# Patient Record
Sex: Male | Born: 1950 | Race: White | Hispanic: No | Marital: Married | Smoking: Never smoker
Health system: Southern US, Community
[De-identification: ages and names within clinical notes are randomized; demographics above are authoritative.]

## PROBLEM LIST (undated history)

## (undated) DIAGNOSIS — E069 Thyroiditis, unspecified: Secondary | ICD-10-CM

## (undated) DIAGNOSIS — I480 Paroxysmal atrial fibrillation: Secondary | ICD-10-CM

## (undated) DIAGNOSIS — I471 Supraventricular tachycardia, unspecified: Secondary | ICD-10-CM

## (undated) DIAGNOSIS — R002 Palpitations: Secondary | ICD-10-CM

## (undated) DIAGNOSIS — F419 Anxiety disorder, unspecified: Secondary | ICD-10-CM

## (undated) DIAGNOSIS — G4730 Sleep apnea, unspecified: Secondary | ICD-10-CM

## (undated) HISTORY — PX: POLYPECTOMY: SHX149

## (undated) HISTORY — PX: COLONOSCOPY: SHX174

## (undated) HISTORY — DX: Supraventricular tachycardia: I47.1

## (undated) HISTORY — DX: Anxiety disorder, unspecified: F41.9

## (undated) HISTORY — DX: Palpitations: R00.2

## (undated) HISTORY — DX: Supraventricular tachycardia, unspecified: I47.10

## (undated) HISTORY — DX: Sleep apnea, unspecified: G47.30

## (undated) HISTORY — DX: Paroxysmal atrial fibrillation: I48.0

## (undated) HISTORY — DX: Thyroiditis, unspecified: E06.9

---

## 2005-04-11 ENCOUNTER — Ambulatory Visit: Payer: Self-pay | Admitting: Cardiovascular Disease

## 2008-08-27 ENCOUNTER — Emergency Department (HOSPITAL_COMMUNITY): Admission: EM | Admit: 2008-08-27 | Discharge: 2008-08-27 | Payer: Self-pay | Admitting: Nurse Practitioner

## 2009-04-28 ENCOUNTER — Emergency Department (HOSPITAL_COMMUNITY): Admission: EM | Admit: 2009-04-28 | Discharge: 2009-04-28 | Payer: Self-pay | Admitting: Emergency Medicine

## 2009-05-04 DIAGNOSIS — R002 Palpitations: Secondary | ICD-10-CM | POA: Insufficient documentation

## 2009-05-07 ENCOUNTER — Ambulatory Visit: Payer: Self-pay | Admitting: Cardiovascular Disease

## 2010-02-25 ENCOUNTER — Encounter (INDEPENDENT_AMBULATORY_CARE_PROVIDER_SITE_OTHER): Payer: Self-pay | Admitting: *Deleted

## 2010-07-08 ENCOUNTER — Encounter: Payer: Self-pay | Admitting: Cardiovascular Disease

## 2010-07-08 ENCOUNTER — Encounter: Payer: Self-pay | Admitting: Internal Medicine

## 2010-07-09 ENCOUNTER — Encounter: Payer: Self-pay | Admitting: Cardiovascular Disease

## 2010-07-10 ENCOUNTER — Telehealth (INDEPENDENT_AMBULATORY_CARE_PROVIDER_SITE_OTHER): Payer: Self-pay | Admitting: *Deleted

## 2010-07-13 ENCOUNTER — Encounter: Payer: Self-pay | Admitting: Cardiovascular Disease

## 2010-07-13 ENCOUNTER — Emergency Department (HOSPITAL_COMMUNITY): Admission: EM | Admit: 2010-07-13 | Discharge: 2010-07-13 | Payer: Self-pay | Admitting: Emergency Medicine

## 2010-07-15 ENCOUNTER — Telehealth: Payer: Self-pay | Admitting: Cardiovascular Disease

## 2010-08-21 ENCOUNTER — Encounter: Payer: Self-pay | Admitting: Internal Medicine

## 2010-08-21 ENCOUNTER — Ambulatory Visit: Payer: Self-pay | Admitting: Internal Medicine

## 2010-08-21 DIAGNOSIS — I471 Supraventricular tachycardia: Secondary | ICD-10-CM | POA: Insufficient documentation

## 2010-09-26 ENCOUNTER — Ambulatory Visit
Admission: RE | Admit: 2010-09-26 | Discharge: 2010-09-26 | Payer: Self-pay | Source: Home / Self Care | Attending: Cardiovascular Disease | Admitting: Cardiovascular Disease

## 2010-10-08 NOTE — Progress Notes (Signed)
  Phone Note Outgoing Call   Call placed by: Scherrie Bateman, LPN,  July 10, 2010 2:41 PM Call placed to: Patient Summary of Call: PT CALLED RE  THE EKG'S DR Eden Emms REVIEWED  DX FLUTTER PER DR NISHAN  NEEDS TO SEE EP IN F/U APPT MADE FOR 08/21/10 AT 11:30 AM WITH DR Johney Frame Initial call taken by: Scherrie Bateman, LPN,  July 10, 2010 2:42 PM

## 2010-10-08 NOTE — Letter (Signed)
Summary: Appointment - Reminder 2  [facility] [employer], Main Office  [address]   [place], [place] [address]   Phone: [phone]  Fax: [phone]     February 25, 2010 MRN: [id number]   Eric Kaufman [address] [place], [place]  [address]   Dear Mr. Zebrowski,  Our records indicate that it is time to schedule a follow-up appointment with Dr. Eden Emms in August. It is very important that we reach you to schedule this appointment. We look forward to participating in your health care needs. Please contact us at the number listed above at your earliest convenience to schedule your appointment.  If you are unable to make an appointment at this time, give [place] a call so we can update our records.     Sincerely,   Migdalia Dk [facility] Scheduling Team

## 2010-10-08 NOTE — Progress Notes (Signed)
Summary: pt wants to talk to nurse  Phone Note Call from Patient Call back at Home Phone [phone]   Caller: Patient Reason for Call: Talk to Nurse, Talk to Doctor Summary of Call: pt needs to discuss if he needs an appt to see Dunbar Buras he talk to Dr. Eden Emms last week and wants to talk to the nurse to confirm some information Initial call taken by: Omer Jack,  July 15, 2010 8:55 AM  Follow-up for Phone Call        spoke with pt, last week he had several episodes of fast heart rate and was able to stop it, but sat he was not able to slow his pulse and went to urgent care and his heart rate was 220. he was given something by EMS and by the time he got to [facility] he was back in the 60's. he questioned if he should take the atenolol, be seen or what he should do next. will get EKG's from urgent care and discuss with dr allred Deliah Goody, RN  July 15, 2010 10:03 AM   Additional Follow-up for Phone Call Additional follow up Details #1::        Per pt calling back, aware that Stanton Kidney is not in the office today. phone # [phone] Lorne Skeens  July 16, 2010 12:27 PM     Additional Follow-up for Phone Call Additional follow up Details #2::    Pt calling in regard to ekg from urgent care and did he need to be seen earlier than 12/14 with Dr. Johney Frame. Mylo Red RN  Additional Follow-up for Phone Call Additional follow up Details #3:: Details for Additional Follow-up Action Taken: Should take Atenolol and ok to be seen by EPS on 12/14    PT AWARE Scherrie Bateman, LPN  July 17, 2010 5:03 PM Additional Follow-up by: Colon Branch, MD, [facility],  July 17, 2010 10:33 AM

## 2010-10-10 NOTE — Assessment & Plan Note (Signed)
Summary: E4V   Primary Provider:  Eden Emms, MD  CC:  check up.  History of Present Illness: Eric Kaufman is a pleasant 60 yo WM with a h/o tachypalpitations and documented SVT  He reports having abrupt onset and termination of palpitations for many years.  He has previously been able to terminate episodes with vagal maneuvers.  Most recently, he was prescribed mucinex D for URI.  He developed symptomatic tachypalpitations 07/08/10 for which he presented to Urgent care and was documented to have SVT. EKG revealed SVT (Likely atrial flutter) 160 bpm.  The arrhythmia spontaneously terminated and decongestants were discontinued.  He returned to urgent care 07/13/10 with recurrent SVT (a more narrow complex tachycardia at 200 bpm).  His tachycardia terminated and he was given atenolol to take as needed.  He has not taken medicine since that time.  During SVT, he reports palpitations but denies CP, SOB, presyncope, dizziness, syncope, or other symptoms.  He reports occasional palpitations but denies any further sustained arrhythmias. Saw Dr Johney Frame in December and did not want to follow through with ablation or daily meds.    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 As Needed  Allergies (verified): 1)  ! Asa 2)  ! Penicillin  Past History:  Past Medical History: Last updated: 08/21/2010 SVT MVP Papiledema:  Family History: Last updated: 05/10/09 Father died age 35 lung cancer  Social History: Last updated: 2009/05/10  non-smoker, non-drinker  Artist Lots of anxiety  Review of Systems       Denies fever, malais, weight loss, blurry vision, decreased visual acuity, cough, sputum, SOB, hemoptysis, pleuritic pain, palpitaitons, heartburn, abdominal pain, melena, lower extremity edema, claudication, or rash.   Vital Signs:  Patient profile:   60 year old male Height:      76  inches Weight:      146 pounds BMI:     17.84 Pulse rate:   68 / minute Resp:     14 per minute BP sitting:   126 / 69  (left arm)  Vitals Entered By: Kem Parkinson (September 26, 2010 4:18 PM)  Physical Exam  General:  Affect appropriate Healthy:  appears stated age HEENT: normal Neck supple with no adenopathy JVP normal no bruits no thyromegaly Lungs clear with no wheezing and good diaphragmatic motion Heart:  S1/S2 no murmur,rub, gallop or click PMI normal Abdomen: benighn, BS positve, no tenderness, no AAA no bruit.  No HSM or HJR Distal pulses intact with no bruits No edema Neuro non-focal Skin warm and dry    Impression & Recommendations:  Problem # 1:  PSVT (ICD-427.0) All details outlined at length by Dr Johney Frame and myself.  Continue as needed vagal maneuvers.  He is allergic to ASA  [place] score 0 no need for coumadin. Atenolol as needed.  F/U 6 months His updated medication list for this problem includes:    Atenolol 25 Mg Tabs (Atenolol) .Marland Kitchen... As needed  Patient Instructions: 1)  Your physician wants you to follow-up in: 6 MONTHS  You will receive a reminder letter in the mail two months in advance. If you don't receive a letter, please call our office to schedule the follow-up appointment.

## 2010-10-10 NOTE — Assessment & Plan Note (Signed)
Summary: Eric Kaufman   Visit Type:  Initial Consult Primary Provider:  Eden Emms, MD   History of Present Illness: Eric Kaufman is a pleasant 60 yo WM with a h/o tachypalpitations and documented SVT who presents today for EP consultation.  He reports having abrupt onset and termination of palpitations for many years.  He has previously been able to terminate episodes with vagal maneuvers.  Most recently, he was prescribed mucinex D for URI.  He developed symptomatic tachypalpitations 07/08/10 for which he presented to Urgent care and was documented to have SVT. EKG revealed SVT (Likely atrial flutter) 160 bpm.  The arrhythmia spontaneously terminated and decongestants were discontinued.  He returned to urgent care 07/13/10 with recurrent SVT (a more narrow complex tachycardia at 200 bpm).  His tachycardia terminated and he was given atenolol to take as needed.  He has not taken medicine since that time.  During SVT, he reports palpitations but denies CP, SOB, presyncope, dizziness, syncope, or other symptoms.  He reports occasional palpitations but denies any further sustained arrhythmias.   Current Medications (verified): 1)  None  Allergies (verified): 1)  ! Asa 2)  ! Penicillin  Past History:  Past Medical History: SVT MVP Papiledema:  Family History: Reviewed history from 05/04/2009 and no changes required. Father died age 16 lung cancer  Social History: Reviewed history from 05/04/2009 and no changes required.  non-smoker, non-drinker  Artist Lots of anxiety  Review of Systems       All systems are reviewed and negative except as listed in the HPI.   Vital Signs:  Patient profile:   60 year old male Height:      76 inches Weight:      143 pounds BMI:     17.47 Pulse rate:   71 / minute BP sitting:   122 / 70  (left arm)  Vitals Entered By: Laurance Flatten CMA (August 21, 2010 12:06 PM)  Physical Exam  General:  Affect appropriate but anxious appearing Healthy:   appears stated age HEENT: normal Neck supple with no adenopathy JVP normal no bruits no thyromegaly Lungs clear with no wheezing and good diaphragmatic motion Heart:  S1/S2 no murmur,rub, gallop or click PMI normal Abdomen: benighn, BS positve, no tenderness, no AAA no bruit.  No HSM or HJR Distal pulses intact with no bruits No edema Neuro non-focal Skin warm and dry    EKG  Procedure date:  07/08/2010  Findings:      SVT at 160 bpm, likely atrial flutter  EKG  Procedure date:  08/21/2010  Findings:      SVT at 200 bpm  EKG  Procedure date:  08/21/2010  Findings:      sinus rhythm 70 bpm, PR 124, RsR1,   Impression & Recommendations:  Problem # 1:  PSVT (ICD-427.0) The patient presents today for EP consultation regarding SVT.  He has longstanding abrupt onset/ termination of tachypalpitations which terminate with vagal maneuvers.  Recently, he has been documented to have SVT at 200 bpm as well as likely atrial flutter at 160 bpm.  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 His CHADSVasc score is 0.  He reports allergy to aspirin.  I therefore would not recommend anticoagulation at this time. Therapeutic strategies for SVT and atrial flutter including medicine and ablation were discussed in detail with the patient and his spouse today. Risk, benefits, and alternatives to EP study and radiofrequency ablation were also discussed in detail  today. The patient is very anxious and heavily concerned about the risks of ablation.  Though I have informed him that the overall risks of ablation are quite low, he is not ready to consider ablation at this time.  Certainly medical therapy would be a reasonable alternative, however, he does not wish to take daily medicine at this time.  He states that he has managed his arrhythmia with vagal maneuvers "for years" and therefore does not wish to make changes at this time. I have  therefore informed him to follow-up with Dr Eden Emms.  If he decides to take daily medicine cardizem and flecainide would be reasonable options.  Otherwise, if he wishes to further consider ablation, I would be happy to discuss this further at that time.  Patient Instructions: 1)  Follow-up with Dr Eden Emms. 2)  I will see the patient as needed.

## 2010-10-30 NOTE — Letter (Signed)
Summary: [facility]  [facility]   Imported By: Marylou Mccoy 10/21/2010 11:44:42  _____________________________________________________________________  External Attachment:    Type:   Image     Comment:   External Document

## 2010-10-30 NOTE — Letter (Signed)
Summary: [facility]  [facility]   Imported By: Marylou Mccoy 10/21/2010 11:42:37  _____________________________________________________________________  External Attachment:    Type:   Image     Comment:   External Document

## 2010-12-14 LAB — POCT CARDIAC MARKERS
CKMB, poc: <1 ng/mL — ABNORMAL LOW (ref 1.0–8.0)
Myoglobin, poc: 52.1 ng/mL (ref 12–200)
Troponin i, poc: <0.05 ng/mL (ref 0.00–0.09)

## 2010-12-14 LAB — CBC
HCT: 47.8 % (ref 39.0–52.0)
Platelets: 252 10*3/uL (ref 150–400)
RDW: 13.1 % (ref 11.5–15.5)
WBC: 6.2 10*3/uL (ref 4.0–10.5)

## 2010-12-14 LAB — BASIC METABOLIC PANEL
BUN: 11 mg/dL (ref 6–23)
Calcium: 9 mg/dL (ref 8.4–10.5)
Creatinine, Ser: 0.73 mg/dL (ref 0.4–1.5)
GFR calc non Af Amer: >60 mL/min (ref >60)
Glucose, Bld: 94 mg/dL (ref 70–99)
Potassium: 3.6 mEq/L (ref 3.5–5.1)

## 2010-12-14 LAB — DIFFERENTIAL
Basophils Absolute: 0 10*3/uL (ref 0.0–0.1)
Lymphocytes Relative: 26 % (ref 12–46)
Lymphs Abs: 1.6 10*3/uL (ref 0.7–4.0)
Neutro Abs: 3.9 10*3/uL (ref 1.7–7.7)
Neutrophils Relative %: 62 % (ref 43–77)

## 2011-04-13 ENCOUNTER — Inpatient Hospital Stay (INDEPENDENT_AMBULATORY_CARE_PROVIDER_SITE_OTHER)
Admission: RE | Admit: 2011-04-13 | Discharge: 2011-04-13 | Disposition: A | Discharge status: Home / Self Care | Payer: 59 | Source: Ambulatory Visit | Attending: Family Medicine | Admitting: Family Medicine

## 2011-04-13 DIAGNOSIS — M76899 Other specified enthesopathies of unspecified lower limb, excluding foot: Secondary | ICD-10-CM

## 2011-06-06 ENCOUNTER — Other Ambulatory Visit: Payer: Self-pay | Admitting: Emergency Medicine

## 2011-06-06 DIAGNOSIS — E0590 Thyrotoxicosis, unspecified without thyrotoxic crisis or storm: Secondary | ICD-10-CM

## 2011-06-08 ENCOUNTER — Inpatient Hospital Stay (INDEPENDENT_AMBULATORY_CARE_PROVIDER_SITE_OTHER)
Admission: RE | Admit: 2011-06-08 | Discharge: 2011-06-08 | Disposition: A | Discharge status: Home / Self Care | Payer: 59 | Source: Ambulatory Visit | Attending: Emergency Medicine | Admitting: Emergency Medicine

## 2011-06-08 DIAGNOSIS — R070 Pain in throat: Secondary | ICD-10-CM

## 2011-06-08 DIAGNOSIS — J3489 Other specified disorders of nose and nasal sinuses: Secondary | ICD-10-CM

## 2011-06-17 ENCOUNTER — Encounter (HOSPITAL_COMMUNITY)
Admission: RE | Admit: 2011-06-17 | Discharge: 2011-06-17 | Disposition: A | Discharge status: Home / Self Care | Payer: 59 | Source: Ambulatory Visit | Attending: Emergency Medicine | Admitting: Emergency Medicine

## 2011-06-17 DIAGNOSIS — E0590 Thyrotoxicosis, unspecified without thyrotoxic crisis or storm: Secondary | ICD-10-CM | POA: Insufficient documentation

## 2011-06-18 ENCOUNTER — Other Ambulatory Visit: Payer: Self-pay | Admitting: Emergency Medicine

## 2011-06-18 ENCOUNTER — Encounter (HOSPITAL_COMMUNITY)
Admission: RE | Admit: 2011-06-18 | Discharge: 2011-06-18 | Disposition: A | Discharge status: Home / Self Care | Payer: 59 | Source: Ambulatory Visit | Attending: Emergency Medicine | Admitting: Emergency Medicine

## 2011-06-18 DIAGNOSIS — R634 Abnormal weight loss: Secondary | ICD-10-CM | POA: Insufficient documentation

## 2011-06-18 DIAGNOSIS — R5383 Other fatigue: Secondary | ICD-10-CM | POA: Insufficient documentation

## 2011-06-18 DIAGNOSIS — E0590 Thyrotoxicosis, unspecified without thyrotoxic crisis or storm: Secondary | ICD-10-CM

## 2011-06-18 DIAGNOSIS — R946 Abnormal results of thyroid function studies: Secondary | ICD-10-CM | POA: Insufficient documentation

## 2011-06-18 DIAGNOSIS — R5381 Other malaise: Secondary | ICD-10-CM | POA: Insufficient documentation

## 2011-06-18 DIAGNOSIS — M542 Cervicalgia: Secondary | ICD-10-CM | POA: Insufficient documentation

## 2011-06-18 DIAGNOSIS — R450 Nervousness: Secondary | ICD-10-CM | POA: Insufficient documentation

## 2011-06-20 ENCOUNTER — Telehealth: Payer: Self-pay | Admitting: Cardiovascular Disease

## 2011-06-20 NOTE — Telephone Encounter (Signed)
Reviewed and pt is comfortable with Atenolol 50mg  daily for heart rate.  He has had a dull headache at the base of the neck & occasional "woozy feeling" but was told that was from the thyroiditis. Pt is overdue for follow-up with Dr. Eden Emms. Appt made for 07/04/11 Mylo Red RN

## 2011-06-20 NOTE — Telephone Encounter (Signed)
Pt was diagnosed with throiditis, and having trouble with the meds due to his BP, please call

## 2011-06-20 NOTE — Telephone Encounter (Signed)
Pt calls today b/c he has been diagnosed with thyroiditis. His endocrinologist, Dr. Lurene Shadow, told him this could affect his heartrate.  He has noticed his heart rate to be 100 upon waking this morning. Then 83.  He also notes his bp has been as low as 98/62.  He has not taken atenolol this morningThe past few weeks he has been taking 50mg  of Atenolol.  He is concerned that the thyroiditis will last for a few months & with the effect it has on his heart rate would like to know how much atenolol he can take. I will ask DOD to review and return call to pt. Mylo Red RN

## 2011-07-02 ENCOUNTER — Encounter: Payer: Self-pay | Admitting: *Deleted

## 2011-07-03 ENCOUNTER — Encounter: Payer: Self-pay | Admitting: Cardiovascular Disease

## 2011-07-04 ENCOUNTER — Ambulatory Visit (INDEPENDENT_AMBULATORY_CARE_PROVIDER_SITE_OTHER): Payer: 59 | Admitting: Cardiovascular Disease

## 2011-07-04 ENCOUNTER — Encounter: Payer: Self-pay | Admitting: Cardiovascular Disease

## 2011-07-04 DIAGNOSIS — E069 Thyroiditis, unspecified: Secondary | ICD-10-CM | POA: Insufficient documentation

## 2011-07-04 LAB — TSH: TSH: 0.06 u[IU]/mL — ABNORMAL LOW (ref 0.35–5.50)

## 2011-07-04 MED ORDER — ATENOLOL 25 MG PO TABS: ORAL_TABLET | ORAL | Status: DC

## 2011-07-04 NOTE — Progress Notes (Signed)
Eric Kaufman is a pleasant 60 yo WM with a h/o tachypalpitations and documented SVT He reports having abrupt onset and termination of palpitations for many years. He has previously been able to terminate episodes with vagal maneuvers. Most recently, he was prescribed mucinex D for URI. He developed symptomatic tachypalpitations 07/08/10 for which he presented to Urgent care and was documented to have SVT. EKG revealed SVT (Likely atrial flutter) 160 bpm. The arrhythmia spontaneously terminated and decongestants were discontinued. He returned to urgent care 07/13/10 with recurrent SVT (a more narrow complex tachycardia at 200 bpm). His tachycardia terminated and he was given atenolol to take as needed. He has not taken medicine since that time. During SVT, he reports palpitations but denies CP, SOB, presyncope, dizziness, syncope, or other symptoms. He reports occasional palpitations but denies any further sustained arrhythmias. Saw Dr Johney Frame in December and did not want to follow through with ablation or daily meds.   Has thyroiditis now.  TSH less than .01.  Thyroid scan with no uptake.  Seeing [facility].  Will go up on Atenolol with extra 1/2 pill at night.  Hopefully thyroiditis will be self limited.  He has lost 6 lbs and looks gaunt  ROS: Denies fever, malais, weight loss, blurry vision, decreased visual acuity, cough, sputum, SOB, hemoptysis, pleuritic pain, palpitaitons, heartburn, abdominal pain, melena, lower extremity edema, claudication, or rash.  All other systems reviewed and negative  General: Affect appropriate Healthy:  appears stated age HEENT: normal Neck supple with no adenopathy JVP normal no bruits no thyromegaly Lungs clear with no wheezing and good diaphragmatic motion Heart:  S1/S2 no murmur,rub, gallop or click PMI normal Abdomen: benighn, BS positve, no tenderness, no AAA no bruit.  No HSM or HJR Distal pulses intact with no bruits No edema Neuro non-focal Skin warm and dry No  muscular weakness   Current Outpatient Prescriptions  Medication Sig Dispense Refill  . atenolol (TENORMIN) 25 MG tablet Take 25 mg by mouth daily.        . Ibuprofen (ADVIL) 200 MG CAPS Take by mouth as needed.          Allergies  Aspirin and Penicillins  Electrocardiogram:  Assessment and Plan

## 2011-07-04 NOTE — Assessment & Plan Note (Signed)
He wanted TSH and free T4 checked today.  F/U [facility]  Increase Atenolol

## 2011-07-04 NOTE — Progress Notes (Signed)
Addended by: Freddi Starr on: 07/04/2011 11:23 AM   Modules accepted: Orders

## 2011-07-04 NOTE — Patient Instructions (Signed)
Your physician wants you to follow-up in: 3 MONTHS You will receive a reminder letter in the mail two months in advance. If you don't receive a letter, please call our office to schedule the follow-up appointment.   Your physician recommends that you return for lab work in: TODAY  CHANGE ATENOLOL TO 25 MG IN THE MORNING AND 1/2 TABLET IN THE EVENING

## 2011-07-04 NOTE — Assessment & Plan Note (Signed)
Stable despite recent Dx of thyroiditis.  Increase Atenolol

## 2011-07-07 ENCOUNTER — Telehealth: Payer: Self-pay | Admitting: Cardiovascular Disease

## 2011-07-07 NOTE — Telephone Encounter (Signed)
New message:  Pt has a question about his medication.  Needs to speak to you regarding same

## 2011-07-07 NOTE — Telephone Encounter (Signed)
Spoke with pt, he was seen last week and we increased his atenolol to 25 mg in the am and 12.5 mg in the pm. He called to report his heart rate in the am when he wakes is 102 to 104. After he gets up his heart rate will drop 20 points, down to 80 without taking any meds. He is concerned and wanted to let dr Eden Emms know Will forward for dr Eden Emms review  Deliah Goody

## 2011-07-07 NOTE — Telephone Encounter (Signed)
Stay on higher dose atenolol since he is still very hyperthyroid

## 2011-08-11 ENCOUNTER — Other Ambulatory Visit: Payer: Self-pay | Admitting: Endocrinology

## 2011-08-11 DIAGNOSIS — E049 Nontoxic goiter, unspecified: Secondary | ICD-10-CM

## 2011-08-29 ENCOUNTER — Encounter: Payer: Self-pay | Admitting: Cardiovascular Disease

## 2011-08-29 ENCOUNTER — Ambulatory Visit (INDEPENDENT_AMBULATORY_CARE_PROVIDER_SITE_OTHER): Payer: 59 | Admitting: Cardiovascular Disease

## 2011-08-29 DIAGNOSIS — I471 Supraventricular tachycardia: Secondary | ICD-10-CM

## 2011-08-29 DIAGNOSIS — E069 Thyroiditis, unspecified: Secondary | ICD-10-CM

## 2011-08-29 NOTE — Assessment & Plan Note (Signed)
Seeing Dr Altheimer.  TSH latest over 3.  No uptake on scan.  F/U TSH in January to monitor for hypothyroidism.  Improved

## 2011-08-29 NOTE — Progress Notes (Signed)
Patient ID: QUILL GRINDER, male   DOB: 17-Mar-1951, 60 y.o.   MRN: [id number] Eric Kaufman is a pleasant 60 yo WM with a h/o tachypalpitations and documented SVT He reports having abrupt onset and termination of palpitations for many years. He has previously been able to terminate episodes with vagal maneuvers. Most recently, he was prescribed mucinex D for URI. He developed symptomatic tachypalpitations 07/08/10 for which he presented to Urgent care and was documented to have SVT. EKG revealed SVT (Likely atrial flutter) 160 bpm. The arrhythmia spontaneously terminated and decongestants were discontinued. He returned to urgent care 07/13/10 with recurrent SVT (a more narrow complex tachycardia at 200 bpm). His tachycardia terminated and he was given atenolol to take as needed. He has not taken medicine since that time. During SVT, he reports palpitations but denies CP, SOB, presyncope, dizziness, syncope, or other symptoms. He reports occasional palpitations but denies any further sustained arrhythmias. Saw Dr Eric Kaufman in December and did not want to follow through with ablation or daily meds.  Has thyroiditis now. . Thyroid scan with no uptake. Seeing Eric Kaufman. Will go up on Atenolol with extra 1/2 pill at night. Hopefully thyroiditis will be self limited. He has lost 6 lbs and looks gaunt  10/26  TSH .06  ROS: Denies fever, malais, weight loss, blurry vision, decreased visual acuity, cough, sputum, SOB, hemoptysis, pleuritic pain, palpitaitons, heartburn, abdominal pain, melena, lower extremity edema, claudication, or rash.  All other systems reviewed and negative  General: Affect appropriate Healthy:  appears stated age HEENT: normal Neck supple with no adenopathy JVP normal no bruits no thyromegaly Lungs clear with no wheezing and good diaphragmatic motion Heart:  S1/S2 no murmur,rub, gallop or click PMI normal Abdomen: benighn, BS positve, no tenderness, no AAA no bruit.  No HSM or HJR Distal pulses  intact with no bruits No edema Neuro non-focal Skin warm and dry No muscular weakness   Current Outpatient Prescriptions  Medication Sig Dispense Refill  . atenolol (TENORMIN) 25 MG tablet TAKE ONE TABLET EVERY MORNING AND 1/2 TABLET IN THE EVENING  45 tablet  12  . Ibuprofen (ADVIL) 200 MG CAPS Take by mouth as needed.          Allergies  Aspirin and Penicillins  Electrocardiogram:  Assessment and Plan

## 2011-08-29 NOTE — Assessment & Plan Note (Signed)
Resolved  Made worse by thyroiditis and suppressed TSH.  Now improved  Beta blocker D/C

## 2011-08-29 NOTE — Patient Instructions (Signed)
Your physician wants you to follow-up in: one year You will receive a reminder letter in the mail two months in advance. If you don't receive a letter, please call our office to schedule the follow-up appointment.  

## 2011-09-09 HISTORY — PX: COLONOSCOPY: SHX174

## 2011-11-07 ENCOUNTER — Emergency Department (INDEPENDENT_AMBULATORY_CARE_PROVIDER_SITE_OTHER)
Admission: EM | Admit: 2011-11-07 | Discharge: 2011-11-07 | Disposition: A | Discharge status: Home Health | Payer: 59 | Source: Home / Self Care | Attending: Family Medicine | Admitting: Family Medicine

## 2011-11-07 ENCOUNTER — Encounter (HOSPITAL_COMMUNITY): Payer: Self-pay | Admitting: Emergency Medicine

## 2011-11-07 DIAGNOSIS — F5102 Adjustment insomnia: Secondary | ICD-10-CM

## 2011-11-07 NOTE — ED Provider Notes (Signed)
History     CSN: [id number]  Arrival date & time 11/07/11  0804   First MD Initiated Contact with Patient 11/07/11 0805      Chief Complaint  Patient presents with  . Insomnia  . Dizziness    (Consider location/radiation/quality/duration/timing/severity/associated sxs/prior treatment) Patient is a 60 y.o. male presenting with weakness. The history is provided by the patient and the spouse.  Weakness Primary symptoms comment: insomnia Episode onset: off and on for sev mos, actually since young man,has had sleep issues, no eval, just want some xanax because gets anxious when doesn't sleep.  Additional symptoms include anxiety and irritability.    Past Medical History  Diagnosis Date  . PSVT   . Palpitations   . Anxiety   . Papilledema     History reviewed. No pertinent past surgical history.  Family History  Problem Relation Age of Onset  . Lung cancer Father     History  Substance Use Topics  . Smoking status: Never Smoker   . Smokeless tobacco: Not on file  . Alcohol Use: No      Review of Systems  Constitutional: Positive for irritability and fatigue.  Psychiatric/Behavioral: Positive for agitation.  All other systems reviewed and are negative.    Allergies  Aspirin and Penicillins  Home Medications   Current Outpatient Rx  Name Route Sig Dispense Refill  . ATENOLOL 25 MG PO TABS  TAKE ONE TABLET EVERY MORNING AND 1/2 TABLET IN THE EVENING 45 tablet 12  . IBUPROFEN 200 MG PO CAPS Oral Take by mouth as needed.        BP 131/83  Pulse 70  Temp(Src) 98.3 F (36.8 C) (Oral)  Resp 17  SpO2 97%  Physical Exam  Nursing note and vitals reviewed. Constitutional: He is oriented to person, place, and time. He appears well-developed and well-nourished.  HENT:  Head: Normocephalic.  Cardiovascular: Normal rate, regular rhythm, normal heart sounds and intact distal pulses.   Pulmonary/Chest: Effort normal and breath sounds normal.  Neurological: He is  alert and oriented to person, place, and time.  Skin: Skin is warm and dry.    ED Course  Procedures (including critical care time)  Labs Reviewed - No data to display No results found.   1. Insomnia, transient       MDM          Barkley Bruns, MD 11/07/11 [phone]

## 2011-11-07 NOTE — ED Notes (Signed)
PT HERE WITH 3 MNTH PERIOD OF INSOMNIA AND FATIGUE WITH X 2 EPISODE OF LIGHTHEADNESS AND FEELING FAINT YESTERDAY.NO VOMITING OR BOWEL PROBLEMS.LAST BLOOD WORK 05/2011.DENIES PAIN

## 2011-11-07 NOTE — Discharge Instructions (Signed)
Use the melatonin for sleep and see your doctor as planned

## 2011-11-18 ENCOUNTER — Ambulatory Visit (INDEPENDENT_AMBULATORY_CARE_PROVIDER_SITE_OTHER): Payer: 59 | Admitting: Internal Medicine

## 2011-11-18 ENCOUNTER — Encounter: Payer: Self-pay | Admitting: Internal Medicine

## 2011-11-18 DIAGNOSIS — E069 Thyroiditis, unspecified: Secondary | ICD-10-CM

## 2011-11-18 DIAGNOSIS — Z0000 Encounter for general adult medical examination without abnormal findings: Secondary | ICD-10-CM

## 2011-11-18 DIAGNOSIS — I471 Supraventricular tachycardia: Secondary | ICD-10-CM

## 2011-11-18 NOTE — Progress Notes (Signed)
Subjective:    Patient ID: Eric Kaufman, male    DOB: 1951-03-06, 61 y.o.   MRN: [id number]  HPI  61 year old patient who is followed by cardiology for a long history of PSVT. He is seen here to establish with our practice. Presently he is being followed by endocrinology due to resolving thyroiditis. TSH has apparently normalized. He feels quite well and he has regained his earlier weight loss. He has a family history of colon cancer but has yet to have a screening colonoscopy this was discussed.  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  HENT: Negative for hearing loss, ear pain, congestion, rhinorrhea, sneezing, mouth sores, trouble swallowing, neck pain, neck stiffness, dental problem, voice change, sinus pressure and tinnitus.   Eyes: Negative for photophobia, pain, redness and visual disturbance.  Respiratory: Negative for apnea, cough, choking, chest tightness, shortness of breath and wheezing.   Cardiovascular: Positive for palpitations. Negative for chest pain and leg swelling.  Gastrointestinal: Negative for nausea, vomiting, abdominal pain, diarrhea, constipation, blood in stool, abdominal distention, anal bleeding and rectal pain.  Genitourinary: Negative for dysuria, urgency, frequency, hematuria, flank pain, decreased urine volume, discharge, penile swelling, scrotal swelling, difficulty urinating, genital sores and testicular pain.  Musculoskeletal: Negative for myalgias, back pain, joint swelling, arthralgias and gait problem.  Skin: Negative for color change, rash and wound.  Neurological: Negative for dizziness, tremors, seizures, syncope, facial asymmetry, speech difficulty,  weakness, light-headedness, numbness and headaches.  Hematological: Negative for adenopathy. Does not bruise/bleed easily.  Psychiatric/Behavioral: Positive for sleep disturbance. Negative for suicidal ideas, hallucinations, behavioral problems, confusion, self-injury, dysphoric mood, decreased concentration and agitation. The patient is not nervous/anxious.        Objective:   Physical Exam  Constitutional: He appears well-developed and well-nourished.  HENT:  Head: Normocephalic and atraumatic.  Right Ear: External ear normal.  Left Ear: External ear normal.  Nose: Nose normal.  Mouth/Throat: Oropharynx is clear and moist.  Eyes: Conjunctivae and EOM are normal. Pupils are equal, round, and reactive to light. No scleral icterus.  Neck: Normal range of motion. Neck supple. No JVD present. No thyromegaly present.  Cardiovascular: Regular rhythm, normal heart sounds and intact distal pulses.  Exam reveals no gallop and no friction rub.   No murmur heard. Pulmonary/Chest: Effort normal and breath sounds normal. He exhibits no tenderness.  Abdominal: Soft. Bowel sounds are normal. He exhibits no distension and no mass. There is no tenderness.  Genitourinary: Prostate normal and penis normal. Guaiac negative stool.  Musculoskeletal: Normal range of motion. He exhibits no edema and no tenderness.  Lymphadenopathy:    He has no cervical adenopathy.  Neurological: He is alert. He has normal reflexes. No cranial nerve deficit. Coordination normal.  Skin: Skin is warm and dry. No rash noted.  Psychiatric: He has a normal mood and affect. His behavior is normal.          Assessment & Plan:   PSVT. Continue above vagal maneuvers and when necessary beta blocker therapy Family history colon cancer. We'll schedule screening colonoscopy Episodic insomnia. Will observe at this point he does not wish to try medication other than melatonin at this time Resolving thyroiditis Recheck one year or  as needed

## 2011-12-08 ENCOUNTER — Telehealth: Payer: Self-pay | Admitting: *Deleted

## 2011-12-08 MED ORDER — ALPRAZOLAM 0.25 MG PO TABS: 0.2500 mg | ORAL_TABLET | Freq: Two times a day (BID) | ORAL | Status: AC | PRN

## 2011-12-08 NOTE — Telephone Encounter (Signed)
Called in.

## 2011-12-08 NOTE — Telephone Encounter (Signed)
Please advise - I only see atenolol on med list

## 2011-12-08 NOTE — Telephone Encounter (Signed)
Pt is asking for a prescription for Xanax, please.

## 2011-12-08 NOTE — Telephone Encounter (Signed)
Alprazolam 0.25  #60 one BID prn  RF 2

## 2011-12-09 ENCOUNTER — Encounter: Payer: Self-pay | Admitting: Internal Medicine

## 2011-12-09 ENCOUNTER — Ambulatory Visit (INDEPENDENT_AMBULATORY_CARE_PROVIDER_SITE_OTHER): Payer: 59 | Admitting: Internal Medicine

## 2011-12-09 VITALS — BP 118/80 | Temp 97.8°F | Wt 146.0 lb

## 2011-12-09 DIAGNOSIS — M6740 Ganglion, unspecified site: Secondary | ICD-10-CM

## 2011-12-09 NOTE — Patient Instructions (Signed)
Call or return to clinic prn if these symptoms worsen or fail to improve as anticipated.

## 2011-12-09 NOTE — Progress Notes (Signed)
  Subjective:    Patient ID: Eric Kaufman, male    DOB: Apr 17, 1951, 61 y.o.   MRN: [id number]  HPI  61 year old patient who presents with a chief complaint of a painless nodule involving the extensor surface of his right thumb this has been present for some time but he feels it has increased in size slightly. Remains painless and does not interfere with function.   Review of Systems  Constitutional: Negative.        Objective:   Physical Exam  Constitutional: He appears well-developed and well-nourished. No distress.  Musculoskeletal:       Approximate 8 mm painless firm nodule noted involving the extensor surface of the right distal thumb midway between the DIP joint and base of the nail          Assessment & Plan:   Probable ganglion cyst. Options were discussed. It was elected to observe at this time. The patient will call if this becomes larger painful or interferes with function.

## 2012-01-19 ENCOUNTER — Encounter: Payer: Self-pay | Admitting: Internal Medicine

## 2012-01-19 ENCOUNTER — Ambulatory Visit (INDEPENDENT_AMBULATORY_CARE_PROVIDER_SITE_OTHER): Payer: 59 | Admitting: Internal Medicine

## 2012-01-19 VITALS — BP 118/70 | Temp 97.7°F | Wt 147.0 lb

## 2012-01-19 DIAGNOSIS — J3489 Other specified disorders of nose and nasal sinuses: Secondary | ICD-10-CM

## 2012-01-19 DIAGNOSIS — R0981 Nasal congestion: Secondary | ICD-10-CM

## 2012-01-19 MED ORDER — FLUTICASONE PROPIONATE 50 MCG/ACT NA SUSP: 2.0000 | Freq: Every day | NASAL | Status: DC

## 2012-01-19 NOTE — Patient Instructions (Signed)
Use nasal spray 2 puffs left side once daily  Call if unimproved for an ENT referral

## 2012-01-19 NOTE — Progress Notes (Signed)
  Subjective:    Patient ID: Eric Kaufman, male    DOB: 1951-02-08, 61 y.o.   MRN: [id number]  HPI  61 year old patient who presents with a history of intermittent left nasal congestion. Symptoms usually resolve after a day or 2. No localized sinus pain or drainage or epistaxis.    Review of Systems  HENT: Positive for congestion.        Objective:   Physical Exam  Constitutional: He appears well-developed and well-nourished. No distress.  HENT:  Head: Normocephalic and atraumatic.  Mouth/Throat: Oropharynx is clear and moist.       The right nares was unremarkable The left nares was quite constricted secondary to mucosal edema and probable nasal polyps          Assessment & Plan:   Nasal congestion/polyps. We'll try a daily nasal steroid for a period of time. If ineffective we'll set up for ENT evaluation

## 2012-01-20 ENCOUNTER — Ambulatory Visit (INDEPENDENT_AMBULATORY_CARE_PROVIDER_SITE_OTHER): Payer: 59 | Admitting: Cardiovascular Disease

## 2012-01-20 ENCOUNTER — Encounter: Payer: Self-pay | Admitting: Cardiovascular Disease

## 2012-01-20 VITALS — BP 115/69 | HR 74 | Ht 71.0 in | Wt 148.0 lb

## 2012-01-20 DIAGNOSIS — R002 Palpitations: Secondary | ICD-10-CM

## 2012-01-20 DIAGNOSIS — I471 Supraventricular tachycardia, unspecified: Secondary | ICD-10-CM

## 2012-01-20 DIAGNOSIS — R0989 Other specified symptoms and signs involving the circulatory and respiratory systems: Secondary | ICD-10-CM

## 2012-01-20 DIAGNOSIS — R0683 Snoring: Secondary | ICD-10-CM

## 2012-01-20 DIAGNOSIS — E069 Thyroiditis, unspecified: Secondary | ICD-10-CM

## 2012-01-20 DIAGNOSIS — R0609 Other forms of dyspnea: Secondary | ICD-10-CM

## 2012-01-20 NOTE — Assessment & Plan Note (Signed)
Continue as needed beta blockers Patient will have sleep study as he notices poor sleep patterns and intermitant snoring as triggers for skips

## 2012-01-20 NOTE — Assessment & Plan Note (Signed)
Offerred F/ U with Dr Johney Frame but he declines.  Prefers to take beta blocker intermitantly

## 2012-01-20 NOTE — Patient Instructions (Addendum)
Your physician wants you to follow-up in:  6 MONTHS WITH DR Haywood Filler will receive a reminder letter in the mail two months in advance. If you don't receive a letter, please call our office to schedule the follow-up appointment. Your physician recommends that you continue on your current medications as directed. Please refer to the Current Medication list given to you today. Your physician has recommended that you have a sleep study. This test records several body functions during sleep, including: brain activity, eye movement, oxygen and carbon dioxide blood levels, heart rate and rhythm, breathing rate and rhythm, the flow of air through your mouth and nose, snoring, body muscle movements, and chest and belly movement. HOME SLEEP STUDY  DX SNORING  PALPITATIONS

## 2012-01-20 NOTE — Assessment & Plan Note (Signed)
Resolved.  He no longer needs to see Alheimer.  Would continue to follow TSH twice yearly to make sure no relapse

## 2012-01-20 NOTE — Progress Notes (Signed)
Patient ID: Eric Kaufman, male   DOB: Jun 26, 1951, 61 y.o.   MRN: [id number] Mr Lara is a pleasant 61 yo WM with a h/o tachypalpitations and documented SVT He reports having abrupt onset and termination of palpitations for many years. He has previously been able to terminate episodes with vagal maneuvers. When taking Mucinex D  he developed symptomatic tachypalpitations 07/08/10 for which he presented to Urgent care and was documented to have SVT. EKG revealed SVT (Likely atrial flutter) 160 bpm. The arrhythmia spontaneously terminated and decongestants were discontinued. He returned to urgent care 07/13/10 with recurrent SVT (a more narrow complex tachycardia at 200 bpm). His tachycardia terminated and he was given atenolol to take as needed. He has not taken medicine since that time. During SVT, he reports palpitations but denies CP, SOB, presyncope, dizziness, syncope, or other symptoms. He reports occasional palpitations but denies any further sustained arrhythmias. Saw Dr Johney Frame in December 2011  and did not want to follow through with ablation or daily meds.   Thyroiditis is better  . Thyroid scan with no uptake. Done seeing Altheimer. Atenolol dose increased last visit. He indicates labs normal now.    07/04/11  TSH .06 He indicates normal last 6 months  Notes poor sleep pattern and snoring related to palpitations   ROS: Denies fever, malais, weight loss, blurry vision, decreased visual acuity, cough, sputum, SOB, hemoptysis, pleuritic pain, palpitaitons, heartburn, abdominal pain, melena, lower extremity edema, claudication, or rash.  All other systems reviewed and negative  General: Affect appropriate Thin and gaunt HEENT: normal Neck supple with no adenopathy JVP normal no bruits no thyromegaly Lungs clear with no wheezing and good diaphragmatic motion Heart:  S1/S2 no murmur, no rub, gallop or click PMI normal Abdomen: benighn, BS positve, no tenderness, no AAA no bruit.  No HSM or  HJR Distal pulses intact with no bruits No edema Neuro non-focal Skin warm and dry No muscular weakness   Current Outpatient Prescriptions  Medication Sig Dispense Refill  . fluticasone (FLONASE) 50 MCG/ACT nasal spray Place 2 sprays into the nose daily.  16 g  6  . DISCONTD: atenolol (TENORMIN) 25 MG tablet TAKE ONE TABLET EVERY MORNING AND 1/2 TABLET IN THE EVENING  45 tablet  12    Allergies  Aspirin and Penicillins  Electrocardiogram:  NSR rate 77 LAE LAFB  Assessment and Plan

## 2012-01-23 ENCOUNTER — Telehealth: Payer: Self-pay | Admitting: Cardiovascular Disease

## 2012-01-23 NOTE — Telephone Encounter (Signed)
New Problem:    Patient called in wanting to know what the status was of scheduling his sleep study. Please call back.

## 2012-01-23 NOTE — Telephone Encounter (Signed)
Wife is aware appt is being worked on for home study sleep test. Will call with date. Mylo Red RN

## 2012-02-04 ENCOUNTER — Telehealth: Payer: Self-pay | Admitting: Internal Medicine

## 2012-02-04 ENCOUNTER — Encounter: Payer: Self-pay | Admitting: Internal Medicine

## 2012-02-04 ENCOUNTER — Ambulatory Visit (INDEPENDENT_AMBULATORY_CARE_PROVIDER_SITE_OTHER): Payer: 59 | Admitting: Internal Medicine

## 2012-02-04 VITALS — BP 98/68 | Temp 97.9°F | Wt 146.0 lb

## 2012-02-04 DIAGNOSIS — R6889 Other general symptoms and signs: Secondary | ICD-10-CM

## 2012-02-04 DIAGNOSIS — J3489 Other specified disorders of nose and nasal sinuses: Secondary | ICD-10-CM

## 2012-02-04 DIAGNOSIS — R0981 Nasal congestion: Secondary | ICD-10-CM

## 2012-02-04 NOTE — Telephone Encounter (Signed)
Please advise ok to refer to this ENT

## 2012-02-04 NOTE — Patient Instructions (Signed)
Continue fluticasone  nasal spray  ENT referral as discussed

## 2012-02-04 NOTE — Telephone Encounter (Signed)
Pt called and said that the ENT that he would like to be referred to is Dr. Narda Bonds [phone]. Pls sch referral and call pt with referral info.

## 2012-02-04 NOTE — Progress Notes (Signed)
  Subjective:    Patient ID: Eric Kaufman, male    DOB: 01-17-1951, 61 y.o.   MRN: [id number]  HPI  61 year old patient who presents complaining of persistent the left nasal congestion. He was seen here earlier and had considerable mucosal edema and there complete closure of his left  nasal passage. He was placed on nasal steroids and has been much better he continues to have fluctuating degrees of nasal congestion on the left side only he states that time there is total resolution of all mucosal edema and at times there is some partial constriction with some yellow nasal drainage. It was felt that the time of his last visit he had significant nasal polyps on the left      Review of Systems  HENT: Positive for congestion and rhinorrhea.        Objective:   Physical Exam  HENT:       The right nasal passages was unremarkable. The left nasal passages was again are constricted there appeared to be at least one polyp at the floor of the left nares.          Assessment & Plan:    Left nasal congestion. Appears to have nasal polyps but according to the patient he at times has total resolution of all the nasal constriction. We'll continue nasal steroids which have been quite helpful. He does have a history of PSVT and topical and oral decongestants are to be avoided. He wishes to be evaluated by ENT. He wishes to discuss with his wife a specific physician. He'll call back with the name of his ENT choice and will refer

## 2012-02-05 NOTE — Telephone Encounter (Signed)
Done

## 2012-02-05 NOTE — Telephone Encounter (Signed)
Please set up referral 

## 2012-02-11 ENCOUNTER — Telehealth: Payer: Self-pay | Admitting: Cardiovascular Disease

## 2012-02-11 NOTE — Telephone Encounter (Signed)
New problem:  Patient calling have another procedure done nasal polyp- putting sleep study on hold until procedure is done

## 2012-02-11 NOTE — Telephone Encounter (Signed)
Pt calling stating he has to put his sleep study off for awhile, as he has a nasal polyp to be removed and feels this should be done first as it might help with breathing--advised i would let dr Eden Emms know

## 2012-02-16 ENCOUNTER — Other Ambulatory Visit: Payer: Self-pay | Admitting: Otolaryngology

## 2012-02-16 DIAGNOSIS — J339 Nasal polyp, unspecified: Secondary | ICD-10-CM

## 2012-02-17 NOTE — Telephone Encounter (Signed)
Ok to take care of polyp first

## 2012-02-19 NOTE — Telephone Encounter (Signed)
PT AWARE OKAY TO  ADDRESS NASAL POLYP .Eric Kaufman

## 2012-02-20 ENCOUNTER — Inpatient Hospital Stay: Admission: RE | Admit: 2012-02-20 | Payer: 59 | Source: Ambulatory Visit

## 2012-02-24 ENCOUNTER — Telehealth: Payer: Self-pay | Admitting: Internal Medicine

## 2012-02-24 DIAGNOSIS — Z1211 Encounter for screening for malignant neoplasm of colon: Secondary | ICD-10-CM

## 2012-02-24 NOTE — Telephone Encounter (Signed)
Pt called req to get a colonoscopy sched with Dr Jarold Motto at [facility]. Pls do a referral.

## 2012-02-25 ENCOUNTER — Encounter: Payer: Self-pay | Admitting: Gastroenterology

## 2012-02-26 ENCOUNTER — Encounter: Payer: Self-pay | Admitting: Internal Medicine

## 2012-02-26 ENCOUNTER — Ambulatory Visit (INDEPENDENT_AMBULATORY_CARE_PROVIDER_SITE_OTHER): Payer: 59 | Admitting: Internal Medicine

## 2012-02-26 VITALS — BP 100/68 | Wt 145.0 lb

## 2012-02-26 DIAGNOSIS — J339 Nasal polyp, unspecified: Secondary | ICD-10-CM

## 2012-02-26 MED ORDER — FLUTICASONE PROPIONATE 50 MCG/ACT NA SUSP: 2.0000 | Freq: Every day | NASAL | Status: DC

## 2012-02-26 NOTE — Patient Instructions (Signed)
Use a facemask while mowing your lawn and other similar outdoor activities  Continue nasal steroids  ENT followup if unimproved

## 2012-02-26 NOTE — Progress Notes (Signed)
  Subjective:    Patient ID: Eric Kaufman, male    DOB: [birth year], 61 y.o.   MRN: [id number]  HPI 61 year old patient who is seen today for followup. He has been seen by ENT for left-sided nasal polyposis and is contemplating surgery. Multiple questions were addressed. Presently he is on nasal steroids. Obstructive symptoms wax and wane   Review of Systems  HENT: Positive for congestion.        Objective:   Physical Exam  HENT:       Left-sided nasal polyposis          Assessment & Plan:   Left-sided nasal polyps. We'll try a to use a mask when he mows lawns. We'll continue nasal steroids. Depending  on his clinical response we'll consider scheduling ENT surgery

## 2012-03-19 ENCOUNTER — Ambulatory Visit (AMBULATORY_SURGERY_CENTER): Payer: 59

## 2012-03-19 VITALS — Ht 71.0 in | Wt 145.8 lb

## 2012-03-19 DIAGNOSIS — Z1211 Encounter for screening for malignant neoplasm of colon: Secondary | ICD-10-CM

## 2012-03-19 MED ORDER — MOVIPREP 100 G PO SOLR: 1.0000 | Freq: Once | ORAL | Status: DC

## 2012-03-31 ENCOUNTER — Encounter: Payer: 59 | Admitting: Gastroenterology

## 2012-04-01 ENCOUNTER — Telehealth: Payer: Self-pay | Admitting: Internal Medicine

## 2012-04-01 ENCOUNTER — Encounter: Payer: Self-pay | Admitting: Internal Medicine

## 2012-04-01 ENCOUNTER — Ambulatory Visit (INDEPENDENT_AMBULATORY_CARE_PROVIDER_SITE_OTHER): Payer: 59 | Admitting: Internal Medicine

## 2012-04-01 VITALS — BP 112/72 | HR 68 | Temp 98.1°F | Wt 144.0 lb

## 2012-04-01 DIAGNOSIS — E069 Thyroiditis, unspecified: Secondary | ICD-10-CM

## 2012-04-01 DIAGNOSIS — R002 Palpitations: Secondary | ICD-10-CM

## 2012-04-01 LAB — BASIC METABOLIC PANEL
BUN: 17 mg/dL (ref 6–23)
CO2: 30 mEq/L (ref 19–32)
Chloride: 103 mEq/L (ref 96–112)
Creatinine, Ser: 0.8 mg/dL (ref 0.4–1.5)
Potassium: 3.8 mEq/L (ref 3.5–5.1)

## 2012-04-01 LAB — T4, FREE: Free T4: 0.8 ng/dL (ref 0.60–1.60)

## 2012-04-01 NOTE — Telephone Encounter (Signed)
appt noted.

## 2012-04-01 NOTE — Telephone Encounter (Signed)
Caller: Koltan/Patient; PCP: Eleonore Chiquito; CB#: [phone];  Call regarding Hx Arrythmia- Having Palpatations and Feeling Lightheaded on an off for past few weeks. Has Hx of Acute episodes of Hypothyroidism- saw Endochrinologist in March and Thyroid Panel normal. He had trouble sleeping last night- maybe d/t soft palate issues. Sx worse today-04/01/12 after mowing  lawn- requesting Labwork.  Triage per Irregular Heartrate Protocol and appnt advised within 24 hours for "Decrease in activity or exercise ability AND ongoing or repeated episodes of irregular pulse..". Scheduled for 04/01/12 @ 1315 with Dr. Artist Pais.

## 2012-04-01 NOTE — Assessment & Plan Note (Signed)
Patient having intermittent ectopic beats. Does not sound like he is having SVGTs. Repeat thyroid function studies. Continue use atenolol as needed.  Proceed with sleep study as recommended by cardiology.

## 2012-04-01 NOTE — Progress Notes (Signed)
  Subjective:    Patient ID: Eric Kaufman, male    DOB: Mar 29, 1951, 61 y.o.   MRN: [id number]  HPI  61 year old white male with history of intermittent palpitations and thyroiditis complains of increased palpitations over last 1 week. He is also experienced mild lightheadedness. His dizziness symptoms are not associated with palpitations. His heart rate has not been significantly elevated. He noticed occasional "skip in his heartbeat". His bout of thyroiditis was in October of 2012. He has been followed by endocrinologist with the last 2 or 3 thyroid function studies being normal.  He denies caffeine use.  No use of OTC medications.   Review of Systems Negative for chest pain or shortness of breath  Past Medical History  Diagnosis Date  . PSVT   . Palpitations   . Anxiety   . Papilledema     History   Social History  . Marital Status: Married    Spouse Name: N/A    Number of Children: N/A  . Years of Education: N/A   Occupational History  . Artist    Social History Main Topics  . Smoking status: Never Smoker   . Smokeless tobacco: Never Used  . Alcohol Use: Yes     occasionaly  . Drug Use: No  . Sexually Active: Not on file   Other Topics Concern  . Not on file   Social History Narrative  . No narrative on file    No past surgical history on file.  Family History  Problem Relation Age of Onset  . Lung cancer Father   . Cancer Father     lung  . Cancer Mother     colon  . Hypertension Mother   . Colon cancer Mother   . Colon polyps Sister   . Colon cancer Maternal Uncle     Allergies  Allergen Reactions  . Aspirin   . Penicillins     No current outpatient prescriptions on file prior to visit.    BP 112/72  Pulse 68  Temp 98.1 F (36.7 C) (Oral)  Wt 144 lb (65.318 kg)       Objective:   Physical Exam  Constitutional: He is oriented to person, place, and time.       Thin, pleasant 61 year old male  HENT:  Head: Normocephalic and  atraumatic.       Negative for exophthalmos  Eyes: Pupils are equal, round, and reactive to light.  Cardiovascular: Normal rate and regular rhythm.   No murmur heard. Pulmonary/Chest: Effort normal and breath sounds normal. He has no wheezes.  Neurological: He is alert and oriented to person, place, and time. No cranial nerve deficit.  Psychiatric: He has a normal mood and affect. His behavior is normal.          Assessment & Plan:

## 2012-04-01 NOTE — Patient Instructions (Addendum)
Our office will contact you re: blood test results.  Please call our office if your dizziness symptoms persist or worsen. Follow up with Dr. Kirtland Bouchard within 1 month

## 2012-04-01 NOTE — Assessment & Plan Note (Signed)
I doubt patient experiencing recurrence.  Monitor TFTs.

## 2012-04-06 ENCOUNTER — Telehealth: Payer: Self-pay

## 2012-04-06 NOTE — Telephone Encounter (Signed)
Error

## 2012-04-06 NOTE — Telephone Encounter (Signed)
noted 

## 2012-04-06 NOTE — Telephone Encounter (Signed)
Pt called req to pick up copy of last lab work. Pt is going to come by and pick up copy today.

## 2012-04-16 ENCOUNTER — Encounter: Payer: Self-pay | Admitting: Gastroenterology

## 2012-04-16 ENCOUNTER — Ambulatory Visit (AMBULATORY_SURGERY_CENTER): Payer: 59 | Admitting: Gastroenterology

## 2012-04-16 VITALS — BP 115/70 | HR 94 | Temp 95.5°F | Resp 16 | Ht 71.0 in | Wt 145.0 lb

## 2012-04-16 DIAGNOSIS — D126 Benign neoplasm of colon, unspecified: Secondary | ICD-10-CM

## 2012-04-16 DIAGNOSIS — Z1211 Encounter for screening for malignant neoplasm of colon: Secondary | ICD-10-CM

## 2012-04-16 DIAGNOSIS — Z800 Family history of malignant neoplasm of digestive organs: Secondary | ICD-10-CM

## 2012-04-16 MED ORDER — SODIUM CHLORIDE 0.9 % IV SOLN: 500.0000 mL | INTRAVENOUS | Status: DC

## 2012-04-16 NOTE — Patient Instructions (Addendum)
YOU HAD AN ENDOSCOPIC PROCEDURE TODAY AT THE [facility]: Refer to the procedure report that was given to you for any specific questions about what was found during the examination.  If the procedure report does not answer your questions, please call your gastroenterologist to clarify.  If you requested that your care partner not be given the details of your procedure findings, then the procedure report has been included in a sealed envelope for you to review at your convenience later.  YOU SHOULD EXPECT: Some feelings of bloating in the abdomen. Passage of more gas than usual.  Walking can help get rid of the air that was put into your GI tract during the procedure and reduce the bloating. If you had a lower endoscopy (such as a colonoscopy or flexible sigmoidoscopy) you may notice spotting of blood in your stool or on the toilet paper. If you underwent a bowel prep for your procedure, then you may not have a normal bowel movement for a few days.  DIET: Your first meal following the procedure should be a light meal and then it is ok to progress to your normal diet.  A half-sandwich or bowl of soup is an example of a good first meal.  Heavy or fried foods are harder to digest and may make you feel nauseous or bloated.  Likewise meals heavy in dairy and vegetables can cause extra gas to form and this can also increase the bloating.  Drink plenty of fluids but you should avoid alcoholic beverages for 24 hours.  ACTIVITY: Your care partner should take you home directly after the procedure.  You should plan to take it easy, moving slowly for the rest of the day.  You can resume normal activity the day after the procedure however you should NOT DRIVE or use heavy machinery for 24 hours (because of the sedation medicines used during the test).    SYMPTOMS TO REPORT IMMEDIATELY: A gastroenterologist can be reached at any hour.  During normal business hours, 8:30 AM to 5:00 PM Monday through Friday,  call [phone].  After hours and on weekends, please call the GI answering service at [phone] who will take a message and have the physician on call contact you.   Following lower endoscopy (colonoscopy or flexible sigmoidoscopy):  Excessive amounts of blood in the stool  Significant tenderness or worsening of abdominal pains  Swelling of the abdomen that is new, acute  Fever of 100F or higher    FOLLOW UP: If any biopsies were taken you will be contacted by phone or by letter within the next 1-3 weeks.  Call your gastroenterologist if you have not heard about the biopsies in 3 weeks.  Our staff will call the home number listed on your records the next business day following your procedure to check on you and address any questions or concerns that you may have at that time regarding the information given to you following your procedure. This is a courtesy call and so if there is no answer at the home number and we have not heard from you through the emergency physician on call, we will assume that you have returned to your regular daily activities without incident.  SIGNATURES/CONFIDENTIALITY: You and/or your care partner have signed paperwork which will be entered into your electronic medical record.  These signatures attest to the fact that that the information above on your After Visit Summary has been reviewed and is understood.  Full responsibility of the confidentiality   of this discharge information lies with you and/or your care-partner.     

## 2012-04-16 NOTE — Op Note (Signed)
[facility] [address] [place], [place]  [address]  COLONOSCOPY PROCEDURE REPORT  PATIENT:  Eric Kaufman, Eric Kaufman  MR#:  [id number] BIRTHDATE:  [birth year], 61 yrs. old  GENDER:  male ENDOSCOPIST:  Vania Rea. Jarold Motto, MD, [facility] REF. BY:  Eleonore Chiquito, M.D. PROCEDURE DATE:  04/16/2012 PROCEDURE:  Colonoscopy with biopsy and snare polypectomy ASA CLASS:  Class II INDICATIONS:  family history of colon cancer MOTHER MEDICATIONS:   propofol (Diprivan) 180 mg IV  DESCRIPTION OF PROCEDURE:   After the risks and benefits and of the procedure were explained, informed consent was obtained. Digital rectal exam was performed and revealed no abnormalities. The LB [id number] endoscope was introduced through the anus and advanced to the cecum, which was identified by both the appendix and ileocecal valve.  The quality of the prep was excellent, using MoviPrep.  The instrument was then slowly withdrawn as the colon was fully examined. <<PROCEDUREIMAGES>>  FINDINGS:  There were multiple polyps identified and removed. in the right colon. SACTTERED RIGHT COLON FLAT 3-6 MM POLYPS COLD BX. AND HOT SNARE REMOVED  A sessile polyp was found in the sigmoid colon. 6 MM SIGMOID POLYP HOT SNARE REMOVED  This was otherwise a normal examination of the colon.   Retroflexed views in the rectum revealed no abnormalities.    The scope was then withdrawn from the patient and the procedure completed.  COMPLICATIONS:  None ENDOSCOPIC IMPRESSION: 1) Polyps, multiple in the right colon 2) Sessile polyp in the sigmoid colon 3) Otherwise normal examination R/O ADENOMAS RECOMMENDATIONS: 1) Await pathology results F/U 3Y IF ADENOMAS PER ++ FH.  REPEAT EXAM:  No  ______________________________ Vania Rea. Jarold Motto, MD, [facility]  CC:  n. eSIGNED:   Vania Rea. Karelly Dewalt at 04/16/2012 09:37 AM  Danley Danker, [phone]

## 2012-04-16 NOTE — Progress Notes (Signed)
Patient did not experience any of the following events: a burn prior to discharge; a fall within the facility; wrong site/side/patient/procedure/implant event; or a hospital transfer or hospital admission upon discharge from the facility. (G8907) Patient did not have preoperative order for IV antibiotic SSI prophylaxis. (G8918)  

## 2012-04-19 ENCOUNTER — Telehealth: Payer: Self-pay | Admitting: *Deleted

## 2012-04-19 NOTE — Telephone Encounter (Signed)
  Follow up Call-  Call back number 04/16/2012  Post procedure Call Back phone  # [phone]  Permission to leave phone message Yes     Patient questions:  Do you have a fever, pain , or abdominal swelling? no Pain Score  0 *  Have you tolerated food without any problems? yes  Have you been able to return to your normal activities? yes  Do you have any questions about your discharge instructions: Diet   no Medications  no Follow up visit  no  Do you have questions or concerns about your Care? no  Actions: * If pain score is 4 or above: No action needed, pain <4.

## 2012-04-21 ENCOUNTER — Encounter: Payer: Self-pay | Admitting: Gastroenterology

## 2012-05-04 ENCOUNTER — Telehealth: Payer: Self-pay | Admitting: Internal Medicine

## 2012-05-04 DIAGNOSIS — IMO0002 Reserved for concepts with insufficient information to code with codable children: Secondary | ICD-10-CM

## 2012-05-04 NOTE — Telephone Encounter (Signed)
Order done

## 2012-05-04 NOTE — Telephone Encounter (Signed)
Pt has cyst on tendon in thumb. Requesting referral

## 2012-05-04 NOTE — Telephone Encounter (Signed)
Please advise 

## 2012-05-04 NOTE — Telephone Encounter (Signed)
Ok for Hydrographic surveyor referral

## 2012-05-18 ENCOUNTER — Ambulatory Visit (INDEPENDENT_AMBULATORY_CARE_PROVIDER_SITE_OTHER): Payer: 59 | Admitting: Internal Medicine

## 2012-05-18 ENCOUNTER — Encounter: Payer: Self-pay | Admitting: Internal Medicine

## 2012-05-18 VITALS — BP 110/70 | HR 94 | Temp 98.1°F | Wt 148.0 lb

## 2012-05-18 DIAGNOSIS — J069 Acute upper respiratory infection, unspecified: Secondary | ICD-10-CM

## 2012-05-18 DIAGNOSIS — J339 Nasal polyp, unspecified: Secondary | ICD-10-CM

## 2012-05-18 DIAGNOSIS — E069 Thyroiditis, unspecified: Secondary | ICD-10-CM

## 2012-05-18 NOTE — Patient Instructions (Addendum)
Call or return to clinic prn if these symptoms worsen or fail to improve as anticipated.

## 2012-05-18 NOTE — Progress Notes (Signed)
  Subjective:    Patient ID: Eric Kaufman, male    DOB: 05-03-1951, 62 y.o.   MRN: [id number]  HPI 61 year old patient who is seen today for followup. He is followed in endocrinology the 2 thyroiditis. No recent thyroid indices have normalized one month ago. Yesterday he became quite achy and had a general sense of unwellness. No fever today he feels improved he was concerned about thyroid dysfunction. He also has had some recent sinus congestion and some discharge on the left. Is considering ENT surgery for left-sided nasal polyposis    Review of Systems  Constitutional: Positive for fatigue.  HENT: Positive for congestion.   Neurological: Positive for weakness.       Objective:   Physical Exam  Constitutional: He is oriented to person, place, and time. He appears well-developed.  HENT:  Head: Normocephalic.  Right Ear: External ear normal.  Left Ear: External ear normal.  Eyes: Conjunctivae and EOM are normal.  Neck: Normal range of motion.  Cardiovascular: Normal rate and normal heart sounds.   Pulmonary/Chest: Breath sounds normal.  Abdominal: Bowel sounds are normal.  Musculoskeletal: Normal range of motion. He exhibits no edema and no tenderness.  Neurological: He is alert and oriented to person, place, and time.  Psychiatric: He has a normal mood and affect. His behavior is normal.          Assessment & Plan:   Probable mild viral syndrome. Improved today. We'll clinically observe. He will follow up with  ENT for consideration of elective polypectomy

## 2012-06-09 ENCOUNTER — Ambulatory Visit (INDEPENDENT_AMBULATORY_CARE_PROVIDER_SITE_OTHER): Payer: 59 | Admitting: Internal Medicine

## 2012-06-09 VITALS — BP 122/82 | HR 64 | Temp 98.0°F | Wt 148.0 lb

## 2012-06-09 DIAGNOSIS — R42 Dizziness and giddiness: Secondary | ICD-10-CM | POA: Insufficient documentation

## 2012-06-09 DIAGNOSIS — H6120 Impacted cerumen, unspecified ear: Secondary | ICD-10-CM | POA: Insufficient documentation

## 2012-06-09 NOTE — Assessment & Plan Note (Signed)
61 year old white male complains of intermittent lightheadedness. He is not orthostatic on exam. Blood pressure was 110/60 sitting and 120/70 standing. I suspect dizziness due to inner ear abnormality/possible eustachian tube dysfunction. Patient advised to use Flonase 2 sprays each nostril once daily. Patient understands to follow up with his primary care physician if symptoms do not improve within 1 to 2 weeks.

## 2012-06-09 NOTE — Patient Instructions (Addendum)
Use flonase as directed Please call our office if your symptoms of dizziness do not improve or gets worse. Follow up with Dr. Amador Cunas within 2 weeks

## 2012-06-09 NOTE — Progress Notes (Signed)
  Subjective:    Patient ID: Eric Kaufman, male    DOB: May 03, 1951, 61 y.o.   MRN: [id number]  HPI  61 year old white male complains of intermittent dizziness. His symptoms started over the weekend. He first noticed it before his dizziness symptoms started, intermittent twinges in right side of his chest. He was unsure whether symptoms were secondary to musculoskeletal etiology versus other. He over last 24-48 hours his lightheadedness has gotten slightly worse. His wife is a Engineer, civil (consulting). She has checked his blood pressure at home. He has not been unusually low. He denies any vertigo symptoms. He does have some pressure sensation back of his neck. He denies any ear symptoms.  Review of Systems No recent illness He denies unsteady gait No nausea or vomiting  Past Medical History  Diagnosis Date  . PSVT   . Palpitations   . Anxiety   . Papilledema     History   Social History  . Marital Status: Married    Spouse Name: N/A    Number of Children: N/A  . Years of Education: N/A   Occupational History  . Artist    Social History Main Topics  . Smoking status: Never Smoker   . Smokeless tobacco: Never Used  . Alcohol Use: Yes     occasionaly  . Drug Use: No  . Sexually Active: Not on file   Other Topics Concern  . Not on file   Social History Narrative  . No narrative on file    Past Surgical History  Procedure Date  . No prior surgery     Family History  Problem Relation Age of Onset  . Lung cancer Father   . Cancer Father     lung  . Cancer Mother     colon  . Hypertension Mother   . Colon cancer Mother   . Colon polyps Sister   . Colon cancer Maternal Uncle     Allergies  Allergen Reactions  . Aspirin Swelling  . Penicillins     Per pt: unknown    No current outpatient prescriptions on file prior to visit.    BP 122/82  Pulse 64  Temp 98 F (36.7 C) (Oral)  Wt 148 lb (67.132 kg)  SpO2 98%        Objective:   Physical Exam  Constitutional:  He is oriented to person, place, and time. He appears well-developed and well-nourished.  HENT:  Head: Normocephalic and atraumatic.        Left cerumen impaction  Cardiovascular: Normal rate, regular rhythm and normal heart sounds.   Pulmonary/Chest: Effort normal and breath sounds normal. He has no wheezes.       No chest wall tenderness.  No rash  Neurological: He is alert and oriented to person, place, and time. Coordination normal.       No pronator drift, negative cerebellar signs Negative Romberg  Psychiatric: He has a normal mood and affect. His behavior is normal.          Assessment & Plan:

## 2012-06-09 NOTE — Assessment & Plan Note (Signed)
Irrigated right ear canal to remove cerumen plug from left ear. No complications.

## 2012-07-21 ENCOUNTER — Ambulatory Visit (INDEPENDENT_AMBULATORY_CARE_PROVIDER_SITE_OTHER): Payer: 59 | Admitting: Cardiovascular Disease

## 2012-07-21 ENCOUNTER — Encounter: Payer: Self-pay | Admitting: Cardiovascular Disease

## 2012-07-21 VITALS — BP 136/74 | HR 70 | Ht 71.0 in | Wt 151.0 lb

## 2012-07-21 DIAGNOSIS — I471 Supraventricular tachycardia: Secondary | ICD-10-CM

## 2012-07-21 DIAGNOSIS — E069 Thyroiditis, unspecified: Secondary | ICD-10-CM

## 2012-07-21 MED ORDER — ATENOLOL 25 MG PO TABS: 25.0000 mg | ORAL_TABLET | Freq: Every day | ORAL | Status: DC

## 2012-07-21 NOTE — Patient Instructions (Signed)
Your physician wants you to follow-up in: YEAR WITH DR NISHAN  You will receive a reminder letter in the mail two months in advance. If you don't receive a letter, please call our office to schedule the follow-up appointment.  Your physician recommends that you continue on your current medications as directed. Please refer to the Current Medication list given to you today. 

## 2012-07-21 NOTE — Assessment & Plan Note (Signed)
Stable refill for atenolol called in

## 2012-07-21 NOTE — Progress Notes (Signed)
Patient ID: Eric Kaufman, male   DOB: [birth year], 61 y.o.   MRN: [id number] Eric Kaufman is a pleasant 61 yo WM with a h/o tachypalpitations and documented SVT He reports having abrupt onset and termination of palpitations for many years. He has previously been able to terminate episodes with vagal maneuvers. Most recently, he was prescribed mucinex D for URI. He developed symptomatic tachypalpitations 07/08/10 for which he presented to Urgent care and was documented to have SVT. EKG revealed SVT (Likely atrial flutter) 160 bpm. The arrhythmia spontaneously terminated and decongestants were discontinued. He returned to urgent care 07/13/10 with recurrent SVT (a more narrow complex tachycardia at 200 bpm). His tachycardia terminated and he was given atenolol to take as needed. He has not taken medicine since that time. During SVT, he reports palpitations but denies CP, SOB, presyncope, dizziness, syncope, or other symptoms. He reports occasional palpitations but denies any further sustained arrhythmias. Saw Dr Eric Kaufman in December and did not want to follow through with ablation or daily meds.   Had thyroiditis last year with weight loss and hyperactive funciton  TSH  1.25  7/13  ROS: Denies fever, malais, weight loss, blurry vision, decreased visual acuity, cough, sputum, SOB, hemoptysis, pleuritic pain, palpitaitons, heartburn, abdominal pain, melena, lower extremity edema, claudication, or rash.  All other systems reviewed and negative  General: Affect appropriate Healthy:  appears stated age HEENT: normal Neck supple with no adenopathy JVP normal no bruits no thyromegaly Lungs clear with no wheezing and good diaphragmatic motion Heart:  S1/S2 no murmur, no rub, gallop or click PMI normal Abdomen: benighn, BS positve, no tenderness, no AAA no bruit.  No HSM or HJR Distal pulses intact with no bruits No edema Neuro non-focal Skin warm and dry No muscular weakness   Current Outpatient  Prescriptions  Medication Sig Dispense Refill  . atenolol (TENORMIN) 25 MG tablet Take 25 mg by mouth daily. If needed      . fluticasone (FLONASE) 50 MCG/ACT nasal spray Place 2 sprays into the nose daily. As needed  16 g  6    Allergies  Aspirin and Penicillins  Electrocardiogram:  Assessment and Plan

## 2012-07-21 NOTE — Assessment & Plan Note (Signed)
Resolved at risk for hypothyroidism in future TSH in 6 months

## 2012-09-08 ENCOUNTER — Emergency Department (HOSPITAL_COMMUNITY): Payer: 59

## 2012-09-08 ENCOUNTER — Emergency Department (HOSPITAL_COMMUNITY)
Admission: EM | Admit: 2012-09-08 | Discharge: 2012-09-08 | Disposition: A | Discharge status: Home / Self Care | Payer: 59 | Attending: Emergency Medicine | Admitting: Emergency Medicine

## 2012-09-08 DIAGNOSIS — F411 Generalized anxiety disorder: Secondary | ICD-10-CM | POA: Insufficient documentation

## 2012-09-08 DIAGNOSIS — I471 Supraventricular tachycardia, unspecified: Secondary | ICD-10-CM | POA: Insufficient documentation

## 2012-09-08 DIAGNOSIS — Z8669 Personal history of other diseases of the nervous system and sense organs: Secondary | ICD-10-CM | POA: Insufficient documentation

## 2012-09-08 DIAGNOSIS — Z8679 Personal history of other diseases of the circulatory system: Secondary | ICD-10-CM | POA: Insufficient documentation

## 2012-09-08 DIAGNOSIS — R0989 Other specified symptoms and signs involving the circulatory and respiratory systems: Secondary | ICD-10-CM | POA: Insufficient documentation

## 2012-09-08 LAB — POCT I-STAT, CHEM 8
HCT: 47 % (ref 39.0–52.0)
Hemoglobin: 16 g/dL (ref 13.0–17.0)
Potassium: 3.8 mEq/L (ref 3.5–5.1)
Sodium: 140 mEq/L (ref 135–145)
TCO2: 29 mmol/L (ref 0–100)

## 2012-09-08 LAB — POCT I-STAT TROPONIN I

## 2012-09-08 MED ORDER — ADENOSINE 6 MG/2ML IV SOLN
12.0000 mg | Freq: Once | INTRAVENOUS | Status: AC
  Administered 2012-09-08: 12 mg via INTRAVENOUS

## 2012-09-08 MED ORDER — ADENOSINE 6 MG/2ML IV SOLN
INTRAVENOUS | Status: AC
  Administered 2012-09-08: 6 mg
  Filled 2012-09-08: qty 6

## 2012-09-08 NOTE — ED Provider Notes (Addendum)
History     CSN: [id number]  Arrival date & time 09/08/12  7846   First MD Initiated Contact with Patient 09/08/12 [phone]      Chief Complaint  Patient presents with  . Tachycardia     HPI Patient comes in with out medications and rapid heart rate which started early this a.m.  Patient has experienced similar symptoms in the past and required IV medication to correct.  Patient did take a beta blocker prior to coming here.  Patient has had upper rest for his symptoms with a temperature as early as this morning.  He did take Tylenol and feels better.  He has had some chest congestion but mostly had congestion in his head. Past Medical History  Diagnosis Date  . PSVT   . Palpitations   . Anxiety   . Papilledema     Past Surgical History  Procedure Date  . No prior surgery     Family History  Problem Relation Age of Onset  . Lung cancer Father   . Cancer Father     lung  . Cancer Mother     colon  . Hypertension Mother   . Colon cancer Mother   . Colon polyps Sister   . Colon cancer Maternal Uncle     History  Substance Use Topics  . Smoking status: Never Smoker   . Smokeless tobacco: Never Used  . Alcohol Use: Yes     Comment: occasionaly      Review of Systems Level V caveat Allergies  Aspirin and Penicillins  Home Medications   Current Outpatient Rx  Name  Route  Sig  Dispense  Refill  . ACETAMINOPHEN 500 MG PO TABS   Oral   Take 500 mg by mouth every 6 (six) hours as needed. For pain/fever         . ALPRAZOLAM 0.25 MG PO TABS   Oral   Take 0.25 mg by mouth 3 (three) times daily as needed. For anxiety         . ATENOLOL 25 MG PO TABS   Oral   Take 1 tablet (25 mg total) by mouth daily. If needed   30 tablet   6     BP 111/69  Pulse 168  Resp 12  SpO2 100%  Physical Exam  Nursing note and vitals reviewed. Constitutional: He is oriented to person, place, and time. He appears well-developed and well-nourished. No distress.  HENT:    Head: Normocephalic and atraumatic.  Eyes: Pupils are equal, round, and reactive to light.  Neck: Normal range of motion.  Cardiovascular: Regular rhythm and intact distal pulses.  Tachycardia present.   Pulmonary/Chest: No respiratory distress.  Abdominal: Normal appearance. He exhibits no distension.  Musculoskeletal: Normal range of motion.  Neurological: He is alert and oriented to person, place, and time. No cranial nerve deficit.  Skin: Skin is warm and dry. No rash noted.  Psychiatric: His behavior is normal. His mood appears anxious.    ED Course  Procedures (including critical care time) Rhythm strip: Narrow complex supraventricular tachycardia with rate being approximately 160 beats per minute.   Medications  ALPRAZolam (XANAX) 0.25 MG tablet (not administered)  acetaminophen (TYLENOL) 500 MG tablet (not administered)  adenosine (ADENOCARD) 6 MG/2ML injection (6 mg  Given 09/08/12 0932)  adenosine (ADENOCARD) 6 MG/2ML injection 12 mg (12 mg Intravenous Given 09/08/12 0935)   Repeat EKG after her treatment: Sinus rhythm rate 81 beats per minute Axis: Right Conduction: Normal  Interpretation: Nonspecific EKG CRITICAL CARE Performed by: Nelva Nay L   Total critical care time: 30 min  Critical care time was exclusive of separately billable procedures and treating other patients.  Critical care was necessary to treat or prevent imminent or life-threatening deterioration.  Critical care was time spent personally by me on the following activities: development of treatment plan with patient and/or surrogate as well as nursing, discussions with consultants, evaluation of patient's response to treatment, examination of patient, obtaining history from patient or surrogate, ordering and performing treatments and interventions, ordering and review of laboratory studies, ordering and review of radiographic studies, pulse oximetry and re-evaluation of patient's condition.  Labs  Reviewed  POCT I-STAT, CHEM 8 - Abnormal; Notable for the following:    Glucose, Bld 104 (*)     All other components within normal limits  POCT I-STAT TROPONIN I   Dg Chest Portable 1 View  09/08/2012  *RADIOLOGY REPORT*  Clinical Data: Tachycardia.  Recent fever and upper respiratory infection.  PORTABLE CHEST - 1 VIEW  Comparison: None.  Findings: The heart is mildly enlarged.  The lungs are free of focal consolidations and pleural effusions.  No pulmonary edema.  IMPRESSION: Cardiomegaly.   Original Report Authenticated By: Norva Pavlov, M.D.      1. PSVT (paroxysmal supraventricular tachycardia)       MDM   After treatment in the ED the patient feels back to baseline and wants to go home.       Nelia Shi, MD 09/08/12 1029  Nelia Shi, MD 09/25/12 [phone]

## 2012-09-08 NOTE — ED Notes (Signed)
MD at bedside. 

## 2012-09-08 NOTE — ED Notes (Signed)
To ED for eval of feeling heart racing. Pt states he took a beta blocker pta. Skin w/d, resp e/u

## 2012-11-08 ENCOUNTER — Ambulatory Visit (INDEPENDENT_AMBULATORY_CARE_PROVIDER_SITE_OTHER): Payer: 59 | Admitting: Internal Medicine

## 2012-11-08 ENCOUNTER — Encounter: Payer: Self-pay | Admitting: Internal Medicine

## 2012-11-08 VITALS — BP 122/70 | HR 85 | Temp 97.5°F | Resp 18 | Wt 154.0 lb

## 2012-11-08 DIAGNOSIS — M25551 Pain in right hip: Secondary | ICD-10-CM

## 2012-11-08 DIAGNOSIS — M25559 Pain in unspecified hip: Secondary | ICD-10-CM

## 2012-11-08 DIAGNOSIS — I471 Supraventricular tachycardia: Secondary | ICD-10-CM

## 2012-11-08 NOTE — Progress Notes (Signed)
Subjective:    Patient ID: Eric Kaufman, male    DOB: [birth year], 62 y.o.   MRN: [id number]  HPI  62 year old patient has a history of PSVT. He presents with a one-week history of some vague fleeting discomfort in the right anterior thigh area. At times he feels that he experiences a. Brief shooting sensation in the right anterior thigh area. At times he feels there is some mild discomfort to palpation in the anterior thigh region. He feels this is I nerve irritation. Denies any pain or paresthesias involving his lateral thigh. Denies any motor weakness. He does feel that his symptoms have improved. He has no pain with activity such as walking up or down stairs  Past Medical History  Diagnosis Date  . PSVT   . Palpitations   . Anxiety   . Papilledema     History   Social History  . Marital Status: Married    Spouse Name: N/A    Number of Children: N/A  . Years of Education: N/A   Occupational History  . Artist    Social History Main Topics  . Smoking status: Never Smoker   . Smokeless tobacco: Never Used  . Alcohol Use: Yes     Comment: occasionaly  . Drug Use: No  . Sexually Active: Not on file   Other Topics Concern  . Not on file   Social History Narrative  . No narrative on file    Past Surgical History  Procedure Laterality Date  . No prior surgery      Family History  Problem Relation Age of Onset  . Lung cancer Father   . Cancer Father     lung  . Cancer Mother     colon  . Hypertension Mother   . Colon cancer Mother   . Colon polyps Sister   . Colon cancer Maternal Uncle     Allergies  Allergen Reactions  . Aspirin Swelling  . Penicillins     Per pt: unknown    Current Outpatient Prescriptions on File Prior to Visit  Medication Sig Dispense Refill  . atenolol (TENORMIN) 25 MG tablet Take 1 tablet (25 mg total) by mouth daily. If needed  30 tablet  6  . ALPRAZolam (XANAX) 0.25 MG tablet Take 0.25 mg by mouth 3 (three) times daily as needed.  For anxiety       No current facility-administered medications on file prior to visit.    BP 122/70  Pulse 85  Temp(Src) 97.5 F (36.4 C) (Oral)  Resp 18  Wt 154 lb (69.854 kg)  BMI 21.49 kg/m2  SpO2 98%       Review of Systems  Constitutional: Negative for fever, chills, appetite change and fatigue.  HENT: Negative for hearing loss, ear pain, congestion, sore throat, trouble swallowing, neck stiffness, dental problem, voice change and tinnitus.   Eyes: Negative for pain, discharge and visual disturbance.  Respiratory: Negative for cough, chest tightness, wheezing and stridor.   Cardiovascular: Negative for chest pain, palpitations and leg swelling.  Gastrointestinal: Negative for nausea, vomiting, abdominal pain, diarrhea, constipation, blood in stool and abdominal distention.  Genitourinary: Negative for urgency, hematuria, flank pain, discharge, difficulty urinating and genital sores.  Musculoskeletal: Positive for myalgias. Negative for back pain, joint swelling, arthralgias and gait problem.  Skin: Negative for rash.  Neurological: Negative for dizziness, syncope, speech difficulty, weakness, numbness and headaches.  Hematological: Negative for adenopathy. Does not bruise/bleed easily.  Psychiatric/Behavioral: Negative for behavioral problems and  dysphoric mood. The patient is not nervous/anxious.        Objective:   Physical Exam  Constitutional: He appears well-developed and well-nourished. No distress.  Blood pressure normal  Musculoskeletal:  No muscle atrophy tenderness or abnormalities in the right anterior thigh area Full range of motion of the right hip  No motor weakness          Assessment & Plan:   Right thigh pain. Unclear etiology. Symptoms are mild and fleeting and seem to be improving. Will observe at this time. He will call to is any clinical worsening.

## 2012-11-08 NOTE — Patient Instructions (Signed)
Call or return to clinic prn if these symptoms worsen or fail to improve as anticipated.

## 2012-11-23 ENCOUNTER — Ambulatory Visit (INDEPENDENT_AMBULATORY_CARE_PROVIDER_SITE_OTHER): Payer: 59 | Admitting: Internal Medicine

## 2012-11-23 ENCOUNTER — Encounter: Payer: Self-pay | Admitting: Internal Medicine

## 2012-11-23 VITALS — BP 126/80 | HR 68 | Temp 97.3°F | Resp 18 | Wt 155.0 lb

## 2012-11-23 DIAGNOSIS — M25559 Pain in unspecified hip: Secondary | ICD-10-CM

## 2012-11-23 DIAGNOSIS — M25551 Pain in right hip: Secondary | ICD-10-CM

## 2012-11-23 NOTE — Progress Notes (Signed)
  Subjective:    Patient ID: Eric Kaufman, male    DOB: 04/14/1951, 62 y.o.   MRN: [id number]  HPI  62 year old patient who presents with persistent pain and in the right anterior thigh area. He describes a fleeting paroxysmal pain does seem to start just proximal to the patella and radiates proximally up the anterior thigh. Does not seem to have any alleviating or precipitating factors. Pain is fleeting and occurs without provocation. He states that he is able to walk normally including the upper and down stairs. No history of trauma  Past Medical History  Diagnosis Date  . PSVT   . Palpitations   . Anxiety   . Papilledema     History   Social History  . Marital Status: Married    Spouse Name: N/A    Number of Children: N/A  . Years of Education: N/A   Occupational History  . Artist    Social History Main Topics  . Smoking status: Never Smoker   . Smokeless tobacco: Never Used  . Alcohol Use: Yes     Comment: occasionaly  . Drug Use: No  . Sexually Active: Not on file   Other Topics Concern  . Not on file   Social History Narrative  . No narrative on file    Past Surgical History  Procedure Laterality Date  . No prior surgery      Family History  Problem Relation Age of Onset  . Lung cancer Father   . Cancer Father     lung  . Cancer Mother     colon  . Hypertension Mother   . Colon cancer Mother   . Colon polyps Sister   . Colon cancer Maternal Uncle     Allergies  Allergen Reactions  . Aspirin Swelling  . Penicillins     Per pt: unknown    Current Outpatient Prescriptions on File Prior to Visit  Medication Sig Dispense Refill  . ALPRAZolam (XANAX) 0.25 MG tablet Take 0.25 mg by mouth 3 (three) times daily as needed. For anxiety      . atenolol (TENORMIN) 25 MG tablet Take 1 tablet (25 mg total) by mouth daily. If needed  30 tablet  6   No current facility-administered medications on file prior to visit.    BP 126/80  Pulse 68  Temp(Src)  97.3 F (36.3 C) (Oral)  Resp 18  Wt 155 lb (70.308 kg)  BMI 21.63 kg/m2  SpO2 98%       Review of Systems  Musculoskeletal: Positive for myalgias.       Objective:   Physical Exam  Constitutional: He appears well-developed and well-nourished. No distress.  Musculoskeletal:  Right leg appears normal with full range of motion. No signs of active inflammation  Normal gait without limp Full flexion and extension          Assessment & Plan:   Nonspecific right upper anterior leg pain. Suspect  Musculoligamentous.  Will treat symptomatically with Aleve gentle stretching.

## 2012-11-23 NOTE — Patient Instructions (Signed)
Take Aleve 200 mg twice daily for pain or swelling  Call or return to clinic prn if these symptoms worsen or fail to improve as anticipated. You  may move around, but avoid painful motions and activities.  Apply heat  to the sore area for 15 to 20 minutes 3 or 4 times daily for the next two to 3 days.

## 2013-01-06 ENCOUNTER — Ambulatory Visit (INDEPENDENT_AMBULATORY_CARE_PROVIDER_SITE_OTHER): Payer: 59 | Admitting: Cardiovascular Disease

## 2013-01-06 ENCOUNTER — Encounter: Payer: Self-pay | Admitting: Cardiovascular Disease

## 2013-01-06 VITALS — BP 128/73 | HR 77 | Wt 150.0 lb

## 2013-01-06 DIAGNOSIS — R002 Palpitations: Secondary | ICD-10-CM

## 2013-01-06 DIAGNOSIS — I471 Supraventricular tachycardia: Secondary | ICD-10-CM

## 2013-01-06 DIAGNOSIS — E069 Thyroiditis, unspecified: Secondary | ICD-10-CM

## 2013-01-06 LAB — TSH: TSH: 0.85 u[IU]/mL (ref 0.35–5.50)

## 2013-01-06 NOTE — Assessment & Plan Note (Signed)
Despite 4 episodes in last 4 months does not want ablation or daily drugs.  Atenolol helps and he takes it as needed 50mg   Refills ordered

## 2013-01-06 NOTE — Progress Notes (Signed)
Patient ID: Eric Kaufman, male   DOB: [birth year], 62 y.o.   MRN: [id number] Eric Kaufman is a pleasant 62 yo WM with a h/o tachypalpitations and documented SVT He reports having abrupt onset and termination of palpitations for many years. He has previously been able to terminate episodes with vagal maneuvers. Most recently, he was prescribed mucinex D for URI. He developed symptomatic tachypalpitations 07/08/10 for which he presented to Urgent care and was documented to have SVT. EKG revealed SVT (Likely atrial flutter) 160 bpm. The arrhythmia spontaneously terminated and decongestants were discontinued. He returned to urgent care 07/13/10 with recurrent SVT (a more narrow complex tachycardia at 200 bpm). His tachycardia terminated and he was given atenolol to take as needed. He has not taken medicine since that time. During SVT, he reports palpitations but denies CP, SOB, presyncope, dizziness, syncope, or other symptoms. He reports occasional palpitations but denies any further sustained arrhythmias. Saw Dr Johney Frame in December and did not want to follow through with ablation or daily meds.  Had thyroiditis last year with weight loss and hyperactive funciton TSH 1.25 7/13  ROS: Denies fever, malais, weight loss, blurry vision, decreased visual acuity, cough, sputum, SOB, hemoptysis, pleuritic pain, palpitaitons, heartburn, abdominal pain, melena, lower extremity edema, claudication, or rash.  All other systems reviewed and negative  General: Affect appropriate Healthy:  appears stated age HEENT: normal Neck supple with no adenopathy JVP normal no bruits no thyromegaly Lungs clear with no wheezing and good diaphragmatic motion Heart:  S1/S2 no murmur, no rub, gallop or click PMI normal Abdomen: benighn, BS positve, no tenderness, no AAA no bruit.  No HSM or HJR Distal pulses intact with no bruits No edema Neuro non-focal Skin warm and dry No muscular weakness   Current Outpatient Prescriptions   Medication Sig Dispense Refill  . atenolol (TENORMIN) 25 MG tablet Take 25 mg by mouth as needed. If needed       No current facility-administered medications for this visit.    Allergies  Aspirin and Penicillins  Electrocardiogram: 1/14 SR rate 81 repol abnormality LAD   Assessment and Plan

## 2013-01-06 NOTE — Patient Instructions (Addendum)
Your physician wants you to follow-up in:  YEAR WITH  DR Haywood Filler will receive a reminder letter in the mail two months in advance. If you don't receive a letter, please call our office to schedule the follow-up appointment.  Your physician recommends that you continue on your current medications as directed. Please refer to the Current Medication list given to you today.  Your physician recommends that you return for lab work in:  TSH   T4  DX  785.1

## 2013-01-06 NOTE — Assessment & Plan Note (Signed)
Will draw TSH/T4 today .  Normally sees [facility] but no labs recently

## 2013-01-20 ENCOUNTER — Telehealth: Payer: Self-pay | Admitting: Cardiovascular Disease

## 2013-01-20 NOTE — Telephone Encounter (Signed)
New problem    Per pt experienced some chest pain today that lasted about 30 mins. & he's not sure if he needs to come in but wanted to consult with you about what he should do

## 2013-01-20 NOTE — Telephone Encounter (Signed)
SPOKE WITH PT RE MESSAGE  PT EXPERIENCED  EPISODE OF  CHEST PAIN  STARTING IN DIAPHRAGM RADIATING TO BACK BETWEEN SHOULDER BLADES  AND INTO NECK NOTED SOME PERSPIRING TO  ARMS  LASTED  APPROX  30 MIN  PER PT ALSO EXPERIENCED THIS TYPE OF EPISODE BACK IN November  ONLY LASTING  5-10 MIN  PT IS PAIN FREE AT THIS TIME  WILL FORWARD TO DR Eden Emms FOR REVIEW .Eric Kaufman

## 2013-01-27 ENCOUNTER — Other Ambulatory Visit (HOSPITAL_COMMUNITY): Payer: Self-pay | Admitting: Otolaryngology

## 2013-01-27 DIAGNOSIS — J339 Nasal polyp, unspecified: Secondary | ICD-10-CM

## 2013-02-03 ENCOUNTER — Ambulatory Visit (HOSPITAL_COMMUNITY): Payer: 59

## 2013-07-14 ENCOUNTER — Other Ambulatory Visit: Payer: Self-pay

## 2013-08-11 ENCOUNTER — Ambulatory Visit (INDEPENDENT_AMBULATORY_CARE_PROVIDER_SITE_OTHER): Payer: 59 | Admitting: Cardiovascular Disease

## 2013-08-11 ENCOUNTER — Encounter: Payer: Self-pay | Admitting: Cardiovascular Disease

## 2013-08-11 VITALS — BP 133/75 | HR 78 | Wt 154.0 lb

## 2013-08-11 DIAGNOSIS — E785 Hyperlipidemia, unspecified: Secondary | ICD-10-CM

## 2013-08-11 DIAGNOSIS — E069 Thyroiditis, unspecified: Secondary | ICD-10-CM

## 2013-08-11 DIAGNOSIS — Z8639 Personal history of other endocrine, nutritional and metabolic disease: Secondary | ICD-10-CM | POA: Insufficient documentation

## 2013-08-11 DIAGNOSIS — Z79899 Other long term (current) drug therapy: Secondary | ICD-10-CM

## 2013-08-11 DIAGNOSIS — Z862 Personal history of diseases of the blood and blood-forming organs and certain disorders involving the immune mechanism: Secondary | ICD-10-CM

## 2013-08-11 DIAGNOSIS — I471 Supraventricular tachycardia: Secondary | ICD-10-CM

## 2013-08-11 NOTE — Progress Notes (Signed)
Patient ID: Eric Kaufman, male   DOB: Nov 30, 1950, 62 y.o.   MRN: [id number] Eric Kaufman is a pleasant 62 yo WM with a h/o tachypalpitations and documented SVT He reports having abrupt onset and termination of palpitations for many years. He has previously been able to terminate episodes with vagal maneuvers. Most recently, he was prescribed mucinex D for URI. He developed symptomatic tachypalpitations 07/08/10 for which he presented to Urgent care and was documented to have SVT. EKG revealed SVT (Likely atrial flutter) 160 bpm. The arrhythmia spontaneously terminated and decongestants were discontinued. He returned to urgent care 07/13/10 with recurrent SVT (a more narrow complex tachycardia at 200 bpm). His tachycardia terminated and he was given atenolol to take as needed. He has not taken medicine since that time. During SVT, he reports palpitations but denies CP, SOB, presyncope, dizziness, syncope, or other symptoms. He reports occasional palpitations but denies any further sustained arrhythmias. Saw Dr Eric Kaufman in December and did not want to follow through with ablation or daily meds.  Had thyroiditis last year with weight loss and hyperactive funciton TSH 1.25 7/13  TSH on 01/06/13 normal .8   ROS: Denies fever, malais, weight loss, blurry vision, decreased visual acuity, cough, sputum, SOB, hemoptysis, pleuritic pain, palpitaitons, heartburn, abdominal pain, melena, lower extremity edema, claudication, or rash.  All other systems reviewed and negative  General: Affect appropriate Healthy:  appears stated age HEENT: normal Neck supple with no adenopathy JVP normal no bruits no thyromegaly Lungs clear with no wheezing and good diaphragmatic motion Heart:  S1/S2 no murmur, no rub, gallop or click PMI normal Abdomen: benighn, BS positve, no tenderness, no AAA no bruit.  No HSM or HJR Distal pulses intact with no bruits No edema Neuro non-focal Skin warm and dry No muscular weakness   Current  Outpatient Prescriptions  Medication Sig Dispense Refill  . atenolol (TENORMIN) 25 MG tablet Take 25 mg by mouth as needed. If needed       No current facility-administered medications for this visit.    Allergies  Aspirin and Penicillins  Electrocardiogram:  Assessment and Plan

## 2013-08-11 NOTE — Patient Instructions (Signed)
Your physician wants you to follow-up in: YEAR WITH DR NISHAN  You will receive a reminder letter in the mail two months in advance. If you don't receive a letter, please call our office to schedule the follow-up appointment.  Your physician recommends that you continue on your current medications as directed. Please refer to the Current Medication list given to you today. 

## 2013-08-11 NOTE — Assessment & Plan Note (Signed)
Not on Rx Have not been checked since thyroiditis resolved F/U lipid and liver panel next week Not fasting today

## 2013-08-11 NOTE — Assessment & Plan Note (Signed)
Quiescent Not taking any atenolol at this time

## 2013-08-11 NOTE — Assessment & Plan Note (Signed)
Resolved TSH normal f/u primary

## 2013-08-17 ENCOUNTER — Other Ambulatory Visit: Payer: Self-pay | Admitting: Internal Medicine

## 2013-08-17 DIAGNOSIS — Z862 Personal history of diseases of the blood and blood-forming organs and certain disorders involving the immune mechanism: Secondary | ICD-10-CM

## 2013-08-17 DIAGNOSIS — E785 Hyperlipidemia, unspecified: Secondary | ICD-10-CM

## 2013-08-18 ENCOUNTER — Other Ambulatory Visit (INDEPENDENT_AMBULATORY_CARE_PROVIDER_SITE_OTHER): Payer: 59

## 2013-08-18 DIAGNOSIS — Z79899 Other long term (current) drug therapy: Secondary | ICD-10-CM

## 2013-08-18 DIAGNOSIS — E785 Hyperlipidemia, unspecified: Secondary | ICD-10-CM

## 2013-08-18 DIAGNOSIS — Z862 Personal history of diseases of the blood and blood-forming organs and certain disorders involving the immune mechanism: Secondary | ICD-10-CM

## 2013-08-18 LAB — HEPATIC FUNCTION PANEL
AST: 18 U/L (ref 0–37)
Albumin: 4.2 g/dL (ref 3.5–5.2)
Alkaline Phosphatase: 66 U/L (ref 39–117)
Total Bilirubin: 0.9 mg/dL (ref 0.3–1.2)

## 2013-08-18 LAB — LIPID PANEL
HDL: 51.6 mg/dL (ref >39.00)
LDL Cholesterol: 111 mg/dL — ABNORMAL HIGH (ref 0–99)
Total CHOL/HDL Ratio: 3
Triglycerides: 83 mg/dL (ref 0.0–149.0)

## 2013-12-23 LAB — BASIC METABOLIC PANEL: Glucose: 88 mg/dL

## 2013-12-23 LAB — LIPID PANEL
Cholesterol: 170 mg/dL (ref 0–200)
HDL: 53 mg/dL (ref 35–70)
LDL CALC: 96 mg/dL
LDL/HDL RATIO: 3.2
Triglycerides: 103 mg/dL (ref 40–160)

## 2014-01-20 ENCOUNTER — Encounter: Payer: Self-pay | Admitting: Internal Medicine

## 2014-02-26 ENCOUNTER — Emergency Department (HOSPITAL_COMMUNITY): Payer: Managed Care, Other (non HMO)

## 2014-02-26 ENCOUNTER — Emergency Department (HOSPITAL_COMMUNITY)
Admission: EM | Admit: 2014-02-26 | Discharge: 2014-02-26 | Disposition: A | Discharge status: Home / Self Care | Payer: Managed Care, Other (non HMO) | Attending: Emergency Medicine | Admitting: Emergency Medicine

## 2014-02-26 ENCOUNTER — Encounter (HOSPITAL_COMMUNITY): Payer: Self-pay | Admitting: Emergency Medicine

## 2014-02-26 DIAGNOSIS — Z8659 Personal history of other mental and behavioral disorders: Secondary | ICD-10-CM | POA: Insufficient documentation

## 2014-02-26 DIAGNOSIS — I4891 Unspecified atrial fibrillation: Secondary | ICD-10-CM

## 2014-02-26 DIAGNOSIS — Z8669 Personal history of other diseases of the nervous system and sense organs: Secondary | ICD-10-CM | POA: Insufficient documentation

## 2014-02-26 DIAGNOSIS — Z79899 Other long term (current) drug therapy: Secondary | ICD-10-CM | POA: Insufficient documentation

## 2014-02-26 DIAGNOSIS — Z880 Allergy status to penicillin: Secondary | ICD-10-CM | POA: Insufficient documentation

## 2014-02-26 LAB — CBC
HEMATOCRIT: 47.3 % (ref 39.0–52.0)
HEMOGLOBIN: 16.5 g/dL (ref 13.0–17.0)
MCH: 31.7 pg (ref 26.0–34.0)
MCHC: 34.9 g/dL (ref 30.0–36.0)
MCV: 90.8 fL (ref 78.0–100.0)
Platelets: 245 10*3/uL (ref 150–400)
RBC: 5.21 MIL/uL (ref 4.22–5.81)
RDW: 12.5 % (ref 11.5–15.5)
WBC: 6.7 10*3/uL (ref 4.0–10.5)

## 2014-02-26 LAB — BASIC METABOLIC PANEL
BUN: 21 mg/dL (ref 6–23)
CHLORIDE: 101 meq/L (ref 96–112)
CO2: 25 meq/L (ref 19–32)
Calcium: 8.6 mg/dL (ref 8.4–10.5)
Creatinine, Ser: 0.75 mg/dL (ref 0.50–1.35)
GFR calc Af Amer: >90 mL/min (ref >90)
GFR calc non Af Amer: >90 mL/min (ref >90)
GLUCOSE: 93 mg/dL (ref 70–99)
POTASSIUM: 3.9 meq/L (ref 3.7–5.3)
Sodium: 139 mEq/L (ref 137–147)

## 2014-02-26 LAB — I-STAT TROPONIN, ED: Troponin i, poc: 0.01 ng/mL (ref 0.00–0.08)

## 2014-02-26 LAB — MAGNESIUM: MAGNESIUM: 2.3 mg/dL (ref 1.5–2.5)

## 2014-02-26 LAB — TSH: TSH: 1.47 u[IU]/mL (ref 0.350–4.500)

## 2014-02-26 MED ORDER — DILTIAZEM HCL 30 MG PO TABS
30.0000 mg | ORAL_TABLET | Freq: Once | ORAL | Status: AC
  Administered 2014-02-26: 30 mg via ORAL
  Filled 2014-02-26: qty 1

## 2014-02-26 MED ORDER — DILTIAZEM HCL 30 MG PO TABS: 30.0000 mg | ORAL_TABLET | Freq: Four times a day (QID) | ORAL | Status: DC | PRN

## 2014-02-26 NOTE — Consult Note (Signed)
CARDIOLOGY CONSULT NOTE  Patient ID: Eric Kaufman  MRN: [id number]  DOB: [birth year]  Admit date: 02/26/2014 Date of Consult: 02/26/2014  Primary Physician: Nyoka Cowden, MD Primary Cardiologist: Johnsie Cancel  Reason for Consultation: new onset AFib  History of Present Illness: Eric Kaufman is a 63 y.o. male with a history of paroxysmal SVT and anxiety, previously seen by Dr. Johnsie Cancel, who presents with palpitations.  Kept him from sleeping tonight.  No chest pain, no SOB.  He took 50mg  atenolol at 2:30am which helped slow his heart rate.  No fevers, chills, n/v/d.  He has been under a lot of stress lately.     Past Medical History  Diagnosis Date  . PSVT   . Palpitations   . Anxiety   . Papilledema     Past Surgical History  Procedure Laterality Date  . No prior surgery       Home Meds: Prior to Admission medications   Medication Sig Start Date End Date Taking? Authorizing Provider  atenolol (TENORMIN) 25 MG tablet Take 25 mg by mouth daily as needed. For palpitations 07/21/12  Yes Josue Hector, MD    Current Medications: . diltiazem  30 mg Oral Once     Allergies:    Allergies  Allergen Reactions  . Aspirin Swelling  . Penicillins     Per pt: unknown    Social History:   The patient  reports that he has never smoked. He has never used smokeless tobacco. He reports that he drinks alcohol. He reports that he does not use illicit drugs.    Family History:   The patient's family history includes Cancer in his father and mother; Colon cancer in his maternal uncle and mother; Colon polyps in his sister; Hypertension in his mother; Lung cancer in his father.   ROS:  Please see the history of present illness.  All other systems reviewed and negative.   Vital Signs: Blood pressure 128/72, pulse 110, temperature 98.2 F (36.8 C), temperature source Oral, resp. rate 16, weight 68.04 kg (150 lb), SpO2 98.00%.   PHYSICAL EXAM: General:  Well nourished, well  developed, in no acute distress HEENT: normal Lymph: no adenopathy Neck: no JVD Endocrine:  No thryomegaly Vascular: No carotid bruits; FA pulses 2+ bilaterally without bruits Cardiac:  Irregularly irregular.  no murmur Lungs:  clear to auscultation bilaterally, no wheezing, rhonchi or rales Abd: soft, nontender, no hepatomegaly Ext: no edema Musculoskeletal:  No deformities, BUE and BLE strength normal and equal Skin: warm and dry Neuro:  CNs 2-12 intact, no focal abnormalities noted Psych:  Normal affect   EKG:  AFib, no ischemic changes  Labs: No results found for this basename: CKTOTAL, CKMB, TROPONINI,  in the last 72 hours Lab Results  Component Value Date   WBC 6.7 02/26/2014   HGB 16.5 02/26/2014   HCT 47.3 02/26/2014   MCV 90.8 02/26/2014   PLT 245 02/26/2014    Recent Labs Lab 02/26/14 0343  NA 139  K 3.9  CL 101  CO2 25  BUN 21  CREATININE 0.75  CALCIUM 8.6  GLUCOSE 93   Lab Results  Component Value Date   CHOL 170 12/23/2013   HDL 53 12/23/2013   LDLCALC 96 12/23/2013   TRIG 103 12/23/2013   No results found for this basename: DDIMER    Radiology/Studies:  Dg Chest 2 View  02/26/2014   CLINICAL DATA:  Irregular heartbeat.  History of palpitations.  EXAM: CHEST  2 VIEW  COMPARISON:  Chest radiograph from 09/08/2012  FINDINGS: The lungs are hyperexpanded, with flattening of the hemidiaphragms, compatible with COPD. There is no evidence of focal opacification, pleural effusion or pneumothorax.  The heart is normal in size; the mediastinal contour is within normal limits. No acute osseous abnormalities are seen. A likely small bone island is noted overlying the left midlung zone.  IMPRESSION: Findings of COPD; no acute cardiopulmonary process seen.   Electronically Signed   By: Garald Balding M.D.   On: 02/26/2014 04:24    ASSESSMENT AND PLAN:  1. New onset AFib - CHADS2 VASC score is 0.  Plus he has concerns about anticoagulation given ophthalmic issues.   Alergic to aspirin.  So no anticoagulation or aspirin at this time. - Labs including troponin and potassium normal - exam unremarkable - would add on TSH - Can be managed as outpatient, will request follow up within 2 weeks with Dr. Johnsie Cancel with echocardiogram - Recommend diltiazem 30mg  q6hr PRN, can be used in addition to atenolol as needed.   Cleotis Lema 02/26/2014 5:39 AM

## 2014-02-26 NOTE — ED Notes (Signed)
EKG given to [place] Walk for review

## 2014-02-26 NOTE — ED Notes (Signed)
Patient reports that he has a history of an irregular heart rate  - called a benign arrythmia. Patient denies any pain

## 2014-02-26 NOTE — Discharge Instructions (Signed)
°Emergency Department Resource Guide °1) Find a Doctor and Pay Out of Pocket °Although you won't have to find out who is covered by your insurance plan, it is a good idea to ask around and get recommendations. You will then need to call the office and see if the doctor you have chosen will accept you as a new patient and what types of options they offer for patients who are self-pay. Some doctors offer discounts or will set up payment plans for their patients who do not have insurance, but you will need to ask so you aren't surprised when you get to your appointment. ° °2) Contact Your Local Health Department °Not all health departments have doctors that can see patients for sick visits, but many do, so it is worth a call to see if yours does. If you don't know where your local health department is, you can check in your phone book. The CDC also has a tool to help you locate your state's health department, and many state websites also have listings of all of their local health departments. ° °3) Find a Walk-in Clinic °If your illness is not likely to be very severe or complicated, you may want to try a walk in clinic. These are popping up all over the country in pharmacies, drugstores, and shopping centers. They're usually staffed by nurse practitioners or physician assistants that have been trained to treat common illnesses and complaints. They're usually fairly quick and inexpensive. However, if you have serious medical issues or chronic medical problems, these are probably not your best option. ° °No Primary Care Doctor: °- Call [facility] Connect at  832-8000 - they can help you locate a primary care doctor that  accepts your insurance, provides certain services, etc. °- Physician Referral Service- 1-800-533-3463 ° °Chronic Pain Problems: °Organization         Address  Phone   Notes  °[facility]  (336) 297-2271 Patients need to be referred by their primary care doctor.  ° °Medication  Assistance: °Organization         Address  Phone   Notes  °[place] [facility] [address] °[place], [place] [address] (336) 641-8030 --Must be a resident of [place] °-- Must have NO insurance coverage whatsoever (no Medicaid/ Medicare, etc.) °-- The pt. MUST have a primary care doctor that directs their care regularly and follows them in the community °  °[facility]  (866) 331-1348   °[employer]  (888) 892-1162   ° °Agencies that provide inexpensive medical care: °Organization         Address  Phone   Notes  °[facility]  (336) 832-8035   °[facility]    (336) 832-7272   °[facility] [address] °[place], [place] [address] (336) 832-4777   °[facility] [address], °[place] (336) 271-4999   °[facility]    (336) 373-0678   °[facility]    (336) 272-1050   °[facility] ° [address], [place] Phone:  (336) 832-4444, Fax:  (336) 832-4440 Hours of Operation:  9 am - 6 pm, M-F.  Also accepts Medicaid/Medicare and self-pay.  °[facility] ° [address],  Phone: (336) 832-3150, Fax: (336) [phone]. Hours of Operation:  8:30 am - 5:30 pm, M-F.  Also accepts Medicaid and self-pay.  °[facility] [place] [address], [place] Phone: (336) 878-6027   °[facility] [address], [place], [place] (336)723-1848, Ext. 123 Mondays & Thursdays: 7-9 AM.  First 15 patients are seen on a first come, first serve basis. °  ° °Medicaid-accepting [place] Providers: ° °Organization         Address  Phone   Notes  °[facility] [address], [place] (336) 641-2100 Also accepts self-pay patients.  °[facility] [address], [place] ° (336) 856-9996   °[facility] [address], [place]  (336) 288-8857   °[facility] [address] [place] [address], [place] (336) 299-7000   °Veita Bland [address], [place]  ° (336) 373-1557 Only accepts [place] Access Medicaid patients after they have their name applied to their card.  ° °Self-Pay (no insurance) in [place]: ° °Organization         Address  Phone   Notes  °Sickle Cell Patients, [facility] [address], [place] (336) 832-1970   °[facility] [address], [place] (336) 832-4400   °[facility] ° [address], [place] (336) 992-4800   °[facility]/Dr. Osei-Bonsu ° [address] [place] [address], [place] or [address], [place] (336) 841-8500 Phone number for both [place] and [place] locations is the same.  °[facility] [address], [place] (336) 299-0000   °[facility] [address] [place] [address], [place] or [address] (336) 852-7530 °(336) 878-2260   °[facility] [address], [place] (336) 350-1642, phone; (336) 294-5005, fax Sees patients 1st and 3rd Saturday of every month.  Must not qualify for public or private insurance (i.e. Medicaid, Medicare, [place] Health Choice, Veterans' Benefits) • Household income should be no more than 200% of the poverty level •The clinic cannot treat you if you are pregnant or think you are pregnant • Sexually transmitted diseases are not treated at the clinic.  ° ° °Dental Care: °Organization         Address  Phone  Notes  °[place] [facility] [address], [place] (336) 641-6152 Accepts children up to age 21 who are enrolled in Medicaid or [place] Health Choice; pregnant women with a Medicaid card; and children who have applied for Medicaid or [place] Health Choice, but were declined, whose parents can pay a reduced fee at time of service.  °[place]  [facility] [place]  [address], [place] (336) 641-7733 Accepts children up to age 21 who are enrolled in Medicaid or [place] Health Choice; pregnant women with a Medicaid card; and children who have applied for Medicaid or  Health Choice, but were declined, whose parents can pay a reduced fee at time of service.  °[facility] ° [address], [place] (336) 641-4533 Patients are seen by appointment only. Walk-ins are not accepted. [facility] will see patients 18 years of age and older. °Monday - Tuesday (8am-5pm) °Most Wednesdays (8:30-5pm) °$30 per visit, cash only  °[facility] ° [address], [place] (336) 641-4533 Patients are seen by appointment only. Walk-ins are not accepted. [facility] will see patients 18 years of age and older. °One   Wednesday Evening (Monthly: Volunteer Based).  $30 per visit, cash only  °[facility]  (919) 537-3737 for adults; Children under age 4, call Graduate Pediatric Dentistry at (919) [phone]. Children aged 4-14, please call (919) [phone] to request a pediatric application. ° Dental services are provided in all areas of dental care including fillings, crowns and bridges, complete and partial dentures, implants, gum treatment, root canals, and extractions. Preventive care is also provided. Treatment is provided to both adults and children. °Patients are selected via a lottery and there is often a waiting list. °  °[facility] [address], °[place] ° (336) 763-8833 [email or web address] °  °[facility] [address], [place], [place] (336)723-1848, Ext. 123 Second and Fourth Thursday of each month, opens at 6:30 AM; Clinic ends at 9 AM.  Patients are seen on a first-come first-served basis, and a limited number are seen during each clinic.  ° °[facility] ° [address], [place], [place] (336) 723-7904    Eligibility Requirements °You must have lived in [place], [place], or [place] counties for at least the last three months. °  You cannot be eligible for state or federal sponsored healthcare insurance, including Veterans Administration, Medicaid, or Medicare. °  You generally cannot be eligible for healthcare insurance through your employer.  °  How to apply: °Eligibility screenings are held every Tuesday and Wednesday afternoon from 1:00 pm until 4:00 pm. You do not need an appointment for the interview!  °[facility] [address], [place], [place] 336-631-2330   °[facility]  336-342-8273   °[facility]  336-703-3100   °[facility]  336-570-6415   ° °Behavioral Health Resources in the Community: °Intensive Outpatient Programs °Organization         Address  Phone  Notes  °[facility] [address], [place], [place] 336-878-6098   °[facility] [address], [place], [place] 336-832-9800   °[facility] [address], [place], [place] ° 336-882-2125   °[facility] [address],  °[place], [place] 1-800-853-5163 or 336-641-4981   °Substance Abuse Resources °Organization         Address  Phone  Notes  °[facility]  336-882-2125   °[facility]  336-784-9470   °[facility]  336-285-9073   °[facility]  336-845-3988   °Residential & Outpatient Substance Abuse Program  1-800-659-3381   °Psychological Services °Organization         Address  Phone  Notes  °[facility]  336- 832-9600   °[facility]  336- 378-7881   °[facility] [address], [place] 1-800-853-5163 or 336-641-4981   ° °Mobile Crisis Teams °Organization         Address  Phone  Notes  °[facility], Mobile Crisis Care Unit  1-877-626-1772   °Assertive °[facility] ° [address]  [place], [place] 336-834-9664   °Sharon DeEsch [address] ° [place] 336-554-5454   ° °Self-Help/Support Groups °Organization         Address  Phone             Notes  °[facility]  - variety of support groups  336- 373-1402 Call for more information  °Narcotics Anonymous (NA), [facility] [address], °[place]   2 meetings at this location  ° °  Residential Treatment Programs Organization         Address  Phone  Notes  [facility] [address],    [place]  1-479 699 5827   [facility]  [address] [place] [address], [place], [place]   [place], [place] 706-379-0999 Admissions: 8am-3pm M-F  [facility] [place] [address]    [place], [place] 680-321-2248   [facility] [address] [place], [place], [place]   [facility] [address]  [place], [place]   [facility] Intensive Outpatient [address], [place], [place]   [facility] [address]  [place], [place] 1-(605) 096-9998 or 217-766-0048   [facility] [address], [place], [place] Accepts Medicaid  [facility] [address],  [place] 1-(360)057-3506 Substance Abuse/Addiction Treatment   [facility]         Address  Phone  Notes  [facility]  321-272-3280   Domenic Schwab, PhD [address] [place], [place]   (865)537-4089 or (463) 306-4913   Guffey Villalba [place], [place] (651) 081-8427   [facility] [address] [place], [place] 480-850-8067 Insurance/Medicaid/sponsorship through [facility] Families [address]                                    [place], [place] [phone] [place] [address], [place] ([phone]    Dr. Adele Schilder  ([phone]   [facility] Dept. 1) [address], [place] 2) [place] 3)  [address], [place] [phone]  4235052810   [place] [phone] or [phone] (After Hours)      Avoid avoid caffinated products, such as teas, colas, coffee, chocolate. Avoid over the counter cold medicines, herbal or "natural vitamin" products, and illicit drugs because they can contain stimulants. Take the new prescription as directed. Call your regular Cardiologist on Monday to schedule a follow up appointment this week.  Return to the Emergency Department immediately if worsening.

## 2014-02-26 NOTE — ED Provider Notes (Signed)
CSN: [id number]     Arrival date & time 02/26/14  0302 History   First MD Initiated Contact with Patient 02/26/14 0324     Chief Complaint  Patient presents with  . Irregular Heart Beat     HPI Pt was seen at 0325. Per pt, c/o sudden onset and persistence of multiple intermittent episodes of "palpitations" for the past 2 days. Pt states he has woke up in the middle of the night for the past 2 nights with palpitations, and had one episode of palpitations during the day yesterday. Describes his palpitations as "irregular HR" and "a hard HR." Pt took his usual atenolol with partial improvement in his symptoms, with LD PTA. Pt states he has hx of "SVT for a long time." Denies syncope/near syncope, no CP/SOB, no abd pain, no N/V/D, no back pain, no fevers.    Cards: Dr. Johnsie Cancel Past Medical History  Diagnosis Date  . PSVT   . Palpitations   . Anxiety   . Papilledema    Past Surgical History  Procedure Laterality Date  . No prior surgery     Family History  Problem Relation Age of Onset  . Lung cancer Father   . Cancer Father     lung  . Cancer Mother     colon  . Hypertension Mother   . Colon cancer Mother   . Colon polyps Sister   . Colon cancer Maternal Uncle    History  Substance Use Topics  . Smoking status: Never Smoker   . Smokeless tobacco: Never Used  . Alcohol Use: Yes     Comment: occasionaly    Review of Systems ROS: Statement: All systems negative except as marked or noted in the HPI; Constitutional: Negative for fever and chills. ; ; Eyes: Negative for eye pain, redness and discharge. ; ; ENMT: Negative for ear pain, hoarseness, nasal congestion, sinus pressure and sore throat. ; ; Cardiovascular: +palpitations. Negative for chest pain, diaphoresis, dyspnea and peripheral edema. ; ; Respiratory: Negative for cough, wheezing and stridor. ; ; Gastrointestinal: Negative for nausea, vomiting, diarrhea, abdominal pain, blood in stool, hematemesis, jaundice and rectal  bleeding. . ; ; Genitourinary: Negative for dysuria, flank pain and hematuria. ; ; Musculoskeletal: Negative for back pain and neck pain. Negative for swelling and trauma.; ; Skin: Negative for pruritus, rash, abrasions, blisters, bruising and skin lesion.; ; Neuro: Negative for headache, lightheadedness and neck stiffness. Negative for weakness, altered level of consciousness , altered mental status, extremity weakness, paresthesias, involuntary movement, seizure and syncope.      Allergies  Aspirin and Penicillins  Home Medications   Prior to Admission medications   Medication Sig Start Date End Date Taking? Authorizing Gerad Cornelio  atenolol (TENORMIN) 25 MG tablet Take 25 mg by mouth daily as needed. For palpitations 07/21/12  Yes Josue Hector, MD  diltiazem (CARDIZEM) 30 MG tablet Take 1 tablet (30 mg total) by mouth every 6 (six) hours as needed (for heart rate greater than 100). 02/26/14   Alfonzo Feller, DO   BP 112/84  Pulse 110  Temp(Src) 98.2 F (36.8 C) (Oral)  Resp 16  Wt 150 lb (68.04 kg)  SpO2 98% Physical Exam 0330: Physical examination:  Nursing notes reviewed; Vital signs and O2 SAT reviewed;  Constitutional: Well developed, Well nourished, Well hydrated, In no acute distress; Head:  Normocephalic, atraumatic; Eyes: EOMI, PERRL, No scleral icterus; ENMT: Mouth and pharynx normal, Mucous membranes moist; Neck: Supple, Full range of motion, No  lymphadenopathy; Cardiovascular: Irregular irregular rate and rhythm, No gallop; Respiratory: Breath sounds clear & equal bilaterally, No rales, rhonchi, wheezes.  Speaking full sentences with ease, Normal respiratory effort/excursion; Chest: Nontender, Movement normal; Abdomen: Soft, Nontender, Nondistended, Normal bowel sounds; Genitourinary: No CVA tenderness; Extremities: Pulses normal, No tenderness, No edema, No calf edema or asymmetry.; Neuro: AA&Ox3, Major CN grossly intact.  Speech clear. No gross focal motor or sensory deficits  in extremities. Climbs on and off stretcher easily by himself. Gait steady.; Skin: Color normal, Warm, Dry.     ED Course  Procedures     EKG Interpretation   Date/Time:  Sunday February 26 2014 03:10:12 EDT Ventricular Rate:  98 PR Interval:    QRS Duration: 85 QT Interval:  356 QTC Calculation: 409 R Axis:   -26 Text Interpretation:  Atrial fibrillation Left anterior fasicular block  Left axis deviation Baseline wander When compared with ECG of 09/08/2012  Atrial fibrillation is now Present Confirmed by [facility]  MD, Nunzio Cory  [phone]) on 02/26/2014 3:36:51 AM      MDM  MDM Reviewed: previous chart, nursing note and vitals Reviewed previous: labs and ECG Interpretation: labs, x-ray and ECG   Results for orders placed during the hospital encounter of 02/26/14  CBC      Result Value Ref Range   WBC 6.7  4.0 - 10.5 K/uL   RBC 5.21  4.22 - 5.81 MIL/uL   Hemoglobin 16.5  13.0 - 17.0 g/dL   HCT 47.3  39.0 - 52.0 %   MCV 90.8  78.0 - 100.0 fL   MCH 31.7  26.0 - 34.0 pg   MCHC 34.9  30.0 - 36.0 g/dL   RDW 12.5  11.5 - 15.5 %   Platelets 245  150 - 400 K/uL  BASIC METABOLIC PANEL      Result Value Ref Range   Sodium 139  137 - 147 mEq/L   Potassium 3.9  3.7 - 5.3 mEq/L   Chloride 101  96 - 112 mEq/L   CO2 25  19 - 32 mEq/L   Glucose, Bld 93  70 - 99 mg/dL   BUN 21  6 - 23 mg/dL   Creatinine, Ser 0.75  0.50 - 1.35 mg/dL   Calcium 8.6  8.4 - 10.5 mg/dL   GFR calc non Af Amer >90  >90 mL/min   GFR calc Af Amer >90  >90 mL/min  I-STAT TROPOININ, ED      Result Value Ref Range   Troponin i, poc 0.01  0.00 - 0.08 ng/mL   Comment 3            Dg Chest 2 View 02/26/2014   CLINICAL DATA:  Irregular heartbeat.  History of palpitations.  EXAM: CHEST  2 VIEW  COMPARISON:  Chest radiograph from 09/08/2012  FINDINGS: The lungs are hyperexpanded, with flattening of the hemidiaphragms, compatible with COPD. There is no evidence of focal opacification, pleural effusion or pneumothorax.   The heart is normal in size; the mediastinal contour is within normal limits. No acute osseous abnormalities are seen. A likely small bone island is noted overlying the left midlung zone.  IMPRESSION: Findings of COPD; no acute cardiopulmonary process seen.   Electronically Signed   By: Garald Balding M.D.   On: 02/26/2014 04:24    0535:  HR 90's and irregular while in the ED. Pt ambulated with HR increasing to 110.  Labs reassuring. Pt continues to deny CP/SOB. T/C to [facility] Cardiology Dr. Kennith Center,  case discussed, including:  HPI, pertinent PM/SHx, VS/PE, dx testing, ED course and treatment:  Agreeable to consult, requests he will come to the ED for evaluation.  0630:  Cards Dr. Kennith Center has evaluated pt: he has ordered one dose of PO cardizem, requests EDP to d/c with rx for same (see his consult note). Pt continues to feel "fine" and wants to go home now. HR 90's, irregular on monitor. VS otherwise stable. Dx and testing d/w pt and family.  Questions answered.  Verb understanding, agreeable to d/c home with outpt f/u.     Alfonzo Feller, DO 02/28/14 Dionicia Abler

## 2014-04-20 ENCOUNTER — Telehealth: Payer: Self-pay | Admitting: Cardiovascular Disease

## 2014-04-20 ENCOUNTER — Encounter: Payer: Self-pay | Admitting: Cardiovascular Disease

## 2014-04-20 ENCOUNTER — Other Ambulatory Visit: Payer: Self-pay

## 2014-04-20 ENCOUNTER — Ambulatory Visit (INDEPENDENT_AMBULATORY_CARE_PROVIDER_SITE_OTHER): Payer: Managed Care, Other (non HMO) | Admitting: Cardiovascular Disease

## 2014-04-20 VITALS — BP 126/70 | HR 72 | Ht 71.0 in | Wt 145.0 lb

## 2014-04-20 DIAGNOSIS — I480 Paroxysmal atrial fibrillation: Secondary | ICD-10-CM | POA: Insufficient documentation

## 2014-04-20 DIAGNOSIS — I4891 Unspecified atrial fibrillation: Secondary | ICD-10-CM

## 2014-04-20 DIAGNOSIS — G4730 Sleep apnea, unspecified: Secondary | ICD-10-CM

## 2014-04-20 MED ORDER — ATENOLOL 50 MG PO TABS: 50.0000 mg | ORAL_TABLET | Freq: Every day | ORAL | Status: DC

## 2014-04-20 NOTE — Patient Instructions (Signed)
Your physician has recommended you make the following change in your medication: start taking Atenolol 50 mg daily as needed  Your physician has recommended that you have a at home sleep study. This test records several body functions during sleep, including: brain activity, eye movement, oxygen and carbon dioxide blood levels, heart rate and rhythm, breathing rate and rhythm, the flow of air through your mouth and nose, snoring, body muscle movements, and chest and belly movement.  Your physician wants you to follow-up in: 6 months. You will receive a reminder letter in the mail two months in advance. If you don't receive a letter, please call our office to schedule the follow-up appointment.

## 2014-04-20 NOTE — Progress Notes (Signed)
Patient ID: Eric Kaufman, male   DOB: 1950-10-06, 63 y.o.   MRN: [id number] Eric Kaufman is a pleasant 63 yo WM with a h/o tachypalpitations and documented SVT He reports having abrupt onset and termination of palpitations for many years. He has previously been able to terminate episodes with vagal maneuvers. Most recently, he was prescribed mucinex D for URI. He developed symptomatic tachypalpitations 07/08/10 for which he presented to Urgent care and was documented to have SVT. EKG revealed SVT (Likely atrial flutter) 160 bpm. The arrhythmia spontaneously terminated and decongestants were discontinued. He returned to urgent care 07/13/10 with recurrent SVT (a more narrow complex tachycardia at 200 bpm). His tachycardia terminated and he was given atenolol to take as needed. He has not taken medicine since that time. During SVT, he reports palpitations but denies CP, SOB, presyncope, dizziness, syncope, or other symptoms. He reports occasional palpitations but denies any further sustained arrhythmias. Saw Dr Rayann Heman in December and did not want to follow through with ablation or daily meds.  Had thyroiditis last year with weight loss and hyperactive funciton TSH 1.25 7/13   Seen in ER June with new onset afib Eric Kaufman 1  D/c with atenolol but only takes it as needed Has right eye issue with serous leaks that was stressing him  Also ? Sleep apnea which brings on arrhyhhtmia   ROS: Denies fever, malais, weight loss, blurry vision, decreased visual acuity, cough, sputum, SOB, hemoptysis, pleuritic pain, palpitaitons, heartburn, abdominal pain, melena, lower extremity edema, claudication, or rash.  All other systems reviewed and negative  General: Affect appropriate Thin white male  HEENT: normal Neck supple with no adenopathy JVP normal no bruits no thyromegaly Lungs clear with no wheezing and good diaphragmatic motion Heart:  S1/S2 no murmur, no rub, gallop or click PMI normal Abdomen: benighn, BS  positve, no tenderness, no AAA no bruit.  No HSM or HJR Distal pulses intact with no bruits No edema Neuro non-focal Skin warm and dry No muscular weakness   Current Outpatient Prescriptions  Medication Sig Dispense Refill  . atenolol (TENORMIN) 25 MG tablet Take 25 mg by mouth daily as needed. For palpitations       No current facility-administered medications for this visit.    Allergies  Aspirin and Penicillins  Electrocardiogram:  6/15  afib otherwise normal new   Assessment and Plan

## 2014-04-20 NOTE — Telephone Encounter (Signed)
Pt was seen in Dr. Kyla Balzarine clinic today. He states that he had an episode of tachycardia 20 years ago, and now recently had another episode. Pt states he does not agree with Dr. Kyla Balzarine diagnosis of "Paroxysmal Atrial fibrillation" Pt thinks  it should be tachycardia instead. Pt  thinks that with that diagnosis his insurance premium will go up. Pt is aware that this message will be send to  MD  For reviewing.

## 2014-04-20 NOTE — Telephone Encounter (Signed)
New message  Pt request a call back to discuss the diagnosis from the discharge form. He states that it is inaccurate. No furhter details. Please call

## 2014-04-20 NOTE — Assessment & Plan Note (Signed)
Resolved recent TSH normal

## 2014-04-20 NOTE — Assessment & Plan Note (Signed)
Had episodes in 90,92,95 none since [place] score 1  Does not want to be on chronic beta blocker , flecainide or other drugs.  Has seen [facility]red before and does not want ablation Refill for atenolol called in  Sleep study to r/o apnea which could exacerbate arrhythmia

## 2014-04-20 NOTE — Assessment & Plan Note (Signed)
Resolved only wants to be on PRN atentolol

## 2014-04-21 NOTE — Telephone Encounter (Signed)
Follow Up   Pt is calling to follow up regarding diagnosis. States he would like recent diagnosis of "PAF" removed from his medical record. Please call.

## 2014-04-21 NOTE — Telephone Encounter (Signed)
His ECG from 6/22 clearly documents afib not SVT nothing I can do about that its in the chart

## 2014-04-21 NOTE — Telephone Encounter (Signed)
PT  NOTIFIED ./CY 

## 2014-05-03 IMAGING — CR DG CHEST 1V PORT
2 series · 2 of 2 positions shown · non-contrast
Comparison: None.

CLINICAL DATA: Tachycardia.  Recent fever and upper respiratory
infection.

PORTABLE CHEST - 1 VIEW

[AP (1 of 2)]
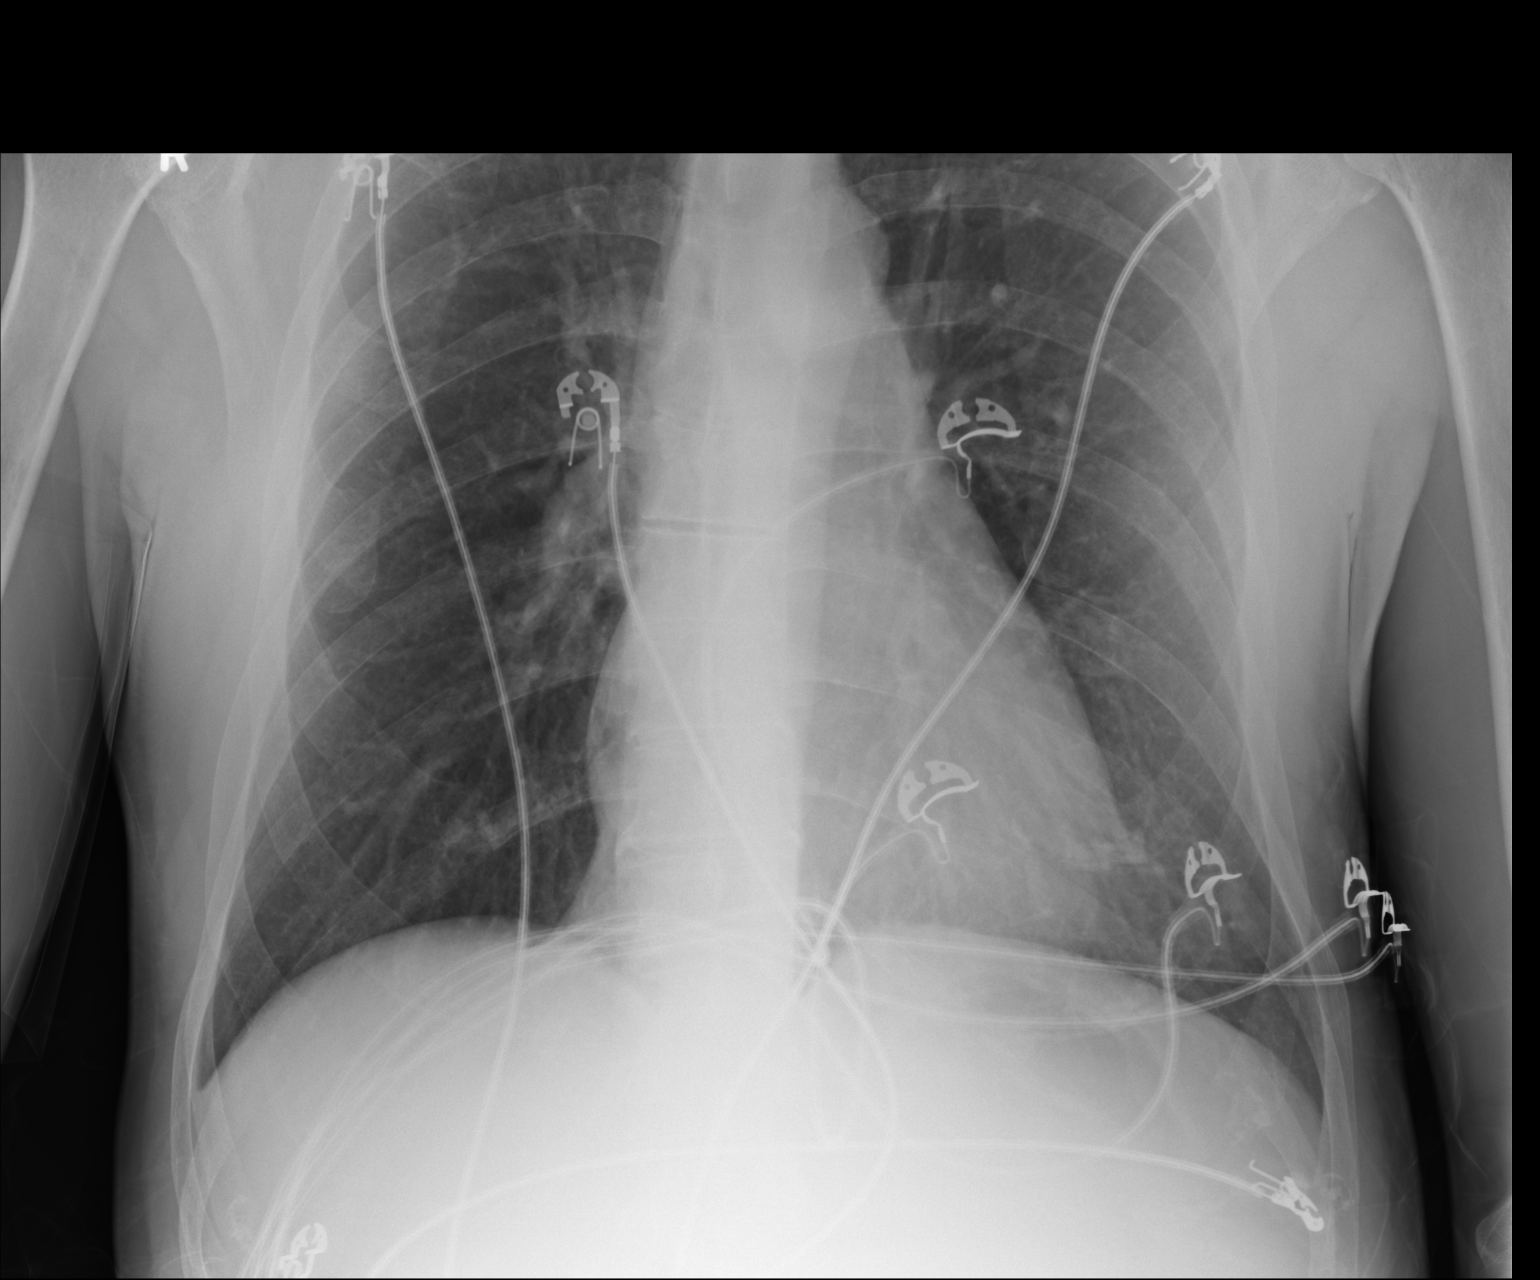

[AP (2 of 2)]
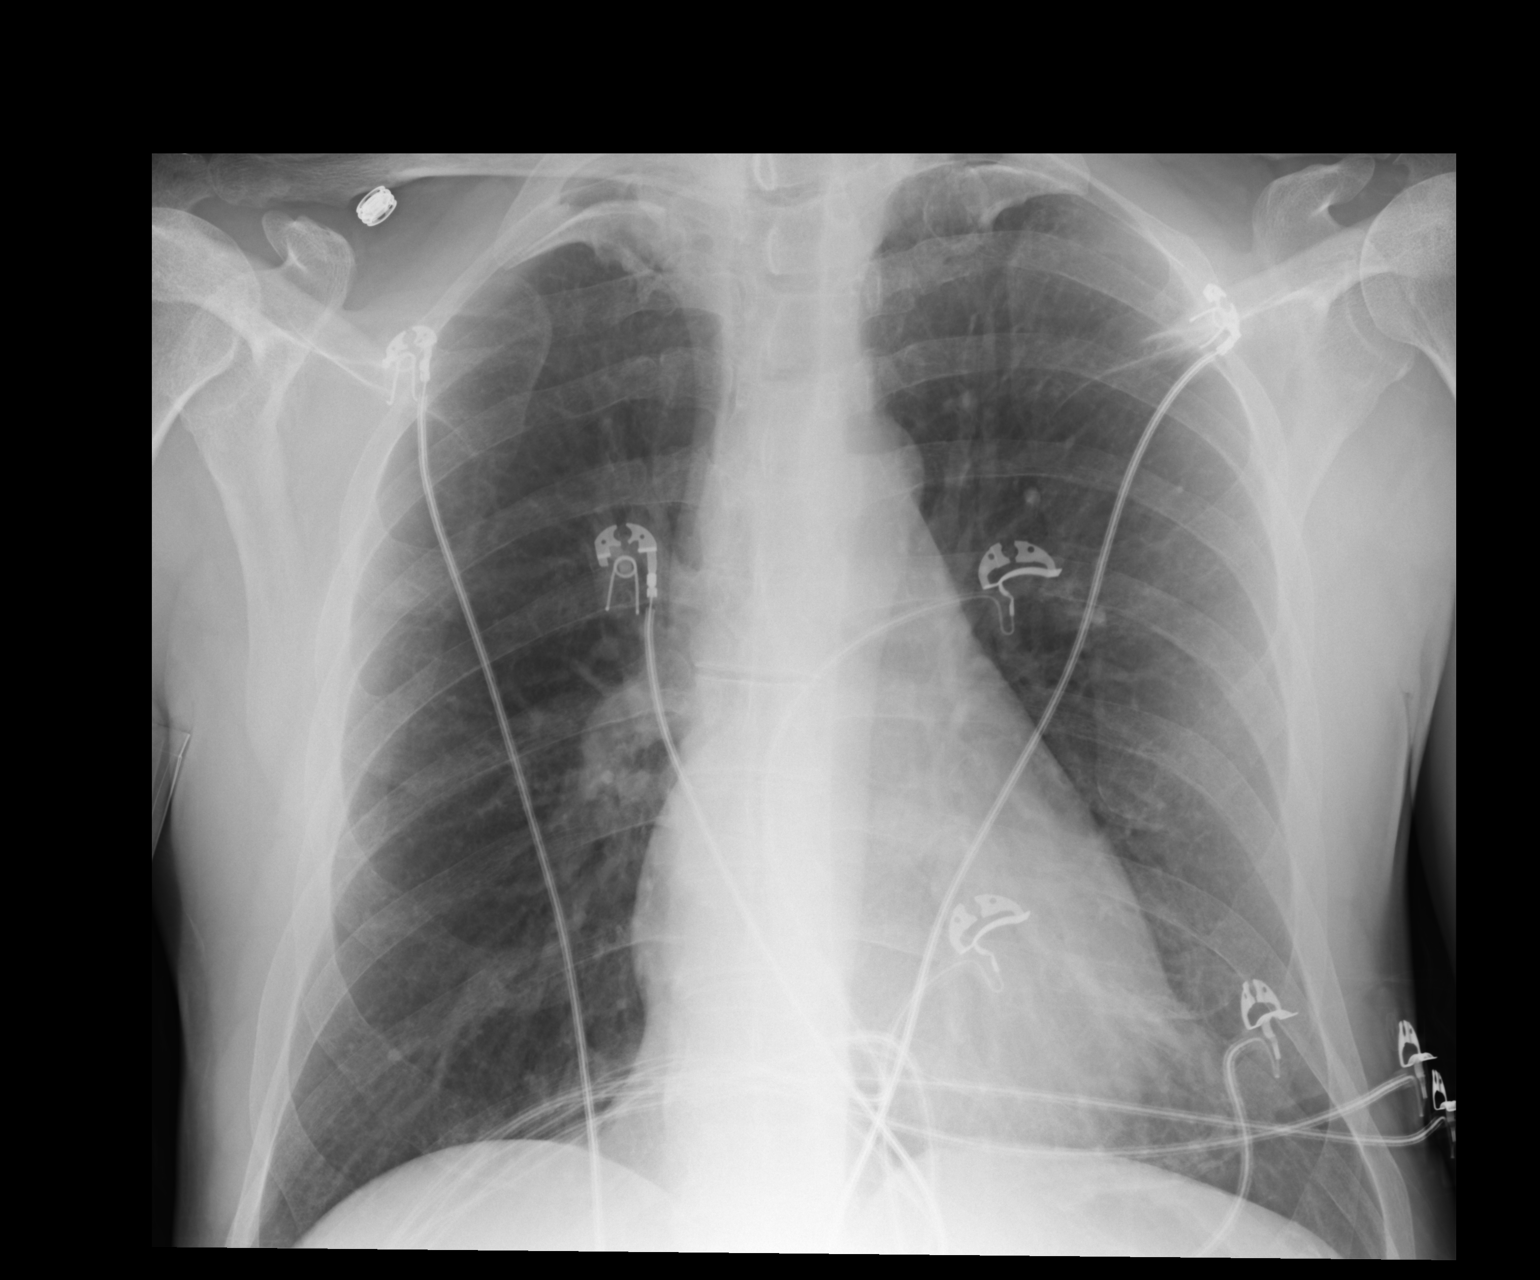

[2 of 2 positions shown; findings below may reference images not displayed]

FINDINGS: The heart is mildly enlarged.  The lungs are free of
focal consolidations and pleural effusions.  No pulmonary edema.
IMPRESSION: Cardiomegaly.

## 2014-06-01 ENCOUNTER — Telehealth: Payer: Self-pay | Admitting: Cardiovascular Disease

## 2014-06-01 NOTE — Telephone Encounter (Signed)
New problem    Pt need to speak to nurse concerning  Having a consult with Dr Rayann Heman about an ablation. Please call pt.

## 2014-06-01 NOTE — Telephone Encounter (Signed)
LMTCB ./CY 

## 2014-06-01 NOTE — Telephone Encounter (Signed)
Follow up ° ° ° ° ° ° ° ° ° °Pt returning nurse call  °

## 2014-06-01 NOTE — Telephone Encounter (Signed)
WOULD  LIKE TO DISCUSS WITH  DR  Rayann Heman  ABLATION  ONCE  AGAIN  ALSO  NEED  TO CHECK ON STATUS OF SLEEP STUDY .Adonis Housekeeper

## 2014-06-02 ENCOUNTER — Other Ambulatory Visit: Payer: Self-pay | Admitting: *Deleted

## 2014-06-02 DIAGNOSIS — G4730 Sleep apnea, unspecified: Secondary | ICD-10-CM

## 2014-06-13 NOTE — Telephone Encounter (Signed)
SNAP FORM FILLED OUT  AND  FAXED  RE  GETTING  HOME SLEEP  STUDY DONE ON PT./CY

## 2014-06-28 ENCOUNTER — Ambulatory Visit: Payer: Managed Care, Other (non HMO) | Admitting: Internal Medicine

## 2014-07-04 ENCOUNTER — Ambulatory Visit: Payer: Managed Care, Other (non HMO) | Admitting: Cardiovascular Disease

## 2014-07-06 ENCOUNTER — Other Ambulatory Visit: Payer: Self-pay | Admitting: Otolaryngology

## 2014-07-06 DIAGNOSIS — J339 Nasal polyp, unspecified: Secondary | ICD-10-CM

## 2014-07-14 ENCOUNTER — Ambulatory Visit
Admission: RE | Admit: 2014-07-14 | Discharge: 2014-07-14 | Disposition: A | Discharge status: Home / Self Care | Payer: 59 | Source: Ambulatory Visit | Attending: Otolaryngology | Admitting: Otolaryngology

## 2014-07-14 DIAGNOSIS — J339 Nasal polyp, unspecified: Secondary | ICD-10-CM

## 2014-07-24 ENCOUNTER — Telehealth: Payer: Self-pay | Admitting: *Deleted

## 2014-07-24 ENCOUNTER — Other Ambulatory Visit: Payer: Self-pay | Admitting: *Deleted

## 2014-07-24 DIAGNOSIS — G4730 Sleep apnea, unspecified: Secondary | ICD-10-CM

## 2014-07-24 NOTE — Telephone Encounter (Signed)
PT  AWARE OF SLEEP STUDY  RESULTS  PER  PT  IS  CURRENTLY  FOLLOWING UP  WITH   ENT   HAS   POLYPS  AS  WELL AS  DEVIATED  SEPTUM WILL CALL  BACK  IF  FEELS  NEEDS TO  FOLLOW WITH PULMONARY  As  WELL./CY

## 2014-07-25 ENCOUNTER — Other Ambulatory Visit: Payer: Self-pay | Admitting: *Deleted

## 2014-07-26 ENCOUNTER — Ambulatory Visit (INDEPENDENT_AMBULATORY_CARE_PROVIDER_SITE_OTHER): Payer: 59 | Admitting: Internal Medicine

## 2014-07-26 ENCOUNTER — Encounter: Payer: Self-pay | Admitting: Internal Medicine

## 2014-07-26 VITALS — BP 102/66 | HR 65 | Temp 98.0°F | Resp 20 | Ht 71.0 in | Wt 150.0 lb

## 2014-07-26 DIAGNOSIS — M25561 Pain in right knee: Secondary | ICD-10-CM

## 2014-07-26 MED ORDER — MELOXICAM 15 MG PO TABS: 15.0000 mg | ORAL_TABLET | Freq: Every day | ORAL | Status: DC

## 2014-07-26 NOTE — Progress Notes (Signed)
Pre visit review using our clinic review tool, if applicable. No additional management support is needed unless otherwise documented below in the visit note. 

## 2014-07-26 NOTE — Progress Notes (Signed)
Subjective:    Patient ID: Eric Kaufman, male    DOB: 12-11-1950, 63 y.o.   MRN: [id number]  HPI  63 year old patient who presents with a three-day history of right medial knee pain.  Over the past day.  This has greatly improved.  There has been no trauma or unusual activities  Past Medical History  Diagnosis Date  . PSVT   . Palpitations   . Anxiety   . Papilledema     History   Social History  . Marital Status: Married    Spouse Name: N/A    Number of Children: N/A  . Years of Education: N/A   Occupational History  . Artist    Social History Main Topics  . Smoking status: Never Smoker   . Smokeless tobacco: Never Used  . Alcohol Use: Yes     Comment: occasionaly  . Drug Use: No  . Sexual Activity: Not on file   Other Topics Concern  . Not on file   Social History Narrative    Past Surgical History  Procedure Laterality Date  . No prior surgery      Family History  Problem Relation Age of Onset  . Lung cancer Father   . Cancer Father     lung  . Cancer Mother     colon  . Hypertension Mother   . Colon cancer Mother   . Colon polyps Sister   . Colon cancer Maternal Uncle     Allergies  Allergen Reactions  . Aspirin Swelling  . Penicillins     Per pt: unknown    Current Outpatient Prescriptions on File Prior to Visit  Medication Sig Dispense Refill  . atenolol (TENORMIN) 50 MG tablet Take 1 tablet (50 mg total) by mouth daily. (Patient taking differently: Take 50 mg by mouth as needed. ) 30 tablet 2   No current facility-administered medications on file prior to visit.    BP 102/66 mmHg  Pulse 65  Temp(Src) 98 F (36.7 C) (Oral)  Resp 20  Ht 5\' 11"  (1.803 m)  Wt 150 lb (68.04 kg)  BMI 20.93 kg/m2  SpO2 97%     Review of Systems  Constitutional: Negative for fever, chills, appetite change and fatigue.  HENT: Negative for congestion, dental problem, ear pain, hearing loss, sore throat, tinnitus, trouble swallowing and voice  change.   Eyes: Negative for pain, discharge and visual disturbance.  Respiratory: Negative for cough, chest tightness, wheezing and stridor.   Cardiovascular: Negative for chest pain, palpitations and leg swelling.  Gastrointestinal: Negative for nausea, vomiting, abdominal pain, diarrhea, constipation, blood in stool and abdominal distention.  Genitourinary: Negative for urgency, hematuria, flank pain, discharge, difficulty urinating and genital sores.  Musculoskeletal: Positive for arthralgias and gait problem. Negative for myalgias, back pain, joint swelling and neck stiffness.  Skin: Negative for rash.  Neurological: Negative for dizziness, syncope, speech difficulty, weakness, numbness and headaches.  Hematological: Negative for adenopathy. Does not bruise/bleed easily.  Psychiatric/Behavioral: Negative for behavioral problems and dysphoric mood. The patient is not nervous/anxious.        Objective:   Physical Exam  Constitutional: He appears well-developed and well-nourished. He appears distressed.  Musculoskeletal:  Mild tenderness along the medial joint line No effusion No signs of active inflammation Full flexion  elicit some discomfort Able to fully extend          Assessment & Plan:   Right medial knee pain.  This has improved over the past 24  hours.  We'll continue anti-inflammatories and observe.  He will call if no clinical improvement.  He does have a aspirin allergy, but has taken a number of anti-inflammatories in the past

## 2014-07-26 NOTE — Patient Instructions (Signed)
Call or return to clinic prn if these symptoms worsen or fail to improve as anticipated.

## 2014-07-27 ENCOUNTER — Telehealth: Payer: Self-pay | Admitting: Internal Medicine

## 2014-07-27 NOTE — Telephone Encounter (Signed)
Pt saw dr Raliegh Ip yesterday and was prescribed  meloxicam for knees. His wife is nurse and on web md it stated pt  should not take med if he has  nasal polyps. Pt is been treated for nasal polyps. Please advise

## 2014-07-27 NOTE — Telephone Encounter (Signed)
Notify patient  the benefits greatly outweigh the small risk of aggravating nasal polyps.  No concern about short-term use of anti-inflammatories

## 2014-07-27 NOTE — Telephone Encounter (Signed)
Please see message and advise 

## 2014-07-27 NOTE — Telephone Encounter (Signed)
Spoke to pt, told him the benefits greatly outweigh the small risk of aggravating nasal polyps. No concern about short-term use of anti-inflammatories per Dr. Raliegh Ip. Pt verbalized understanding.

## 2014-09-05 ENCOUNTER — Institutional Professional Consult (permissible substitution): Payer: 59 | Admitting: Pulmonary Disease

## 2014-09-25 ENCOUNTER — Other Ambulatory Visit: Payer: Self-pay | Admitting: Otolaryngology

## 2014-09-25 DIAGNOSIS — J321 Chronic frontal sinusitis: Secondary | ICD-10-CM

## 2014-10-10 ENCOUNTER — Ambulatory Visit
Admission: RE | Admit: 2014-10-10 | Discharge: 2014-10-10 | Disposition: A | Discharge status: Home / Self Care | Payer: 59 | Source: Ambulatory Visit | Attending: Otolaryngology | Admitting: Otolaryngology

## 2014-10-10 DIAGNOSIS — J321 Chronic frontal sinusitis: Secondary | ICD-10-CM

## 2014-10-30 ENCOUNTER — Ambulatory Visit (INDEPENDENT_AMBULATORY_CARE_PROVIDER_SITE_OTHER): Payer: Managed Care, Other (non HMO) | Admitting: Internal Medicine

## 2014-10-30 ENCOUNTER — Encounter: Payer: Self-pay | Admitting: Internal Medicine

## 2014-10-30 VITALS — BP 120/76 | HR 75 | Temp 98.0°F | Resp 20 | Ht 71.0 in | Wt 150.0 lb

## 2014-10-30 DIAGNOSIS — E069 Thyroiditis, unspecified: Secondary | ICD-10-CM

## 2014-10-30 DIAGNOSIS — I471 Supraventricular tachycardia: Secondary | ICD-10-CM

## 2014-10-30 DIAGNOSIS — R002 Palpitations: Secondary | ICD-10-CM

## 2014-10-30 NOTE — Progress Notes (Signed)
Pre visit review using our clinic review tool, if applicable. No additional management support is needed unless otherwise documented below in the visit note. 

## 2014-10-30 NOTE — Progress Notes (Signed)
Subjective:    Patient ID: Eric Kaufman, male    DOB: [birth year], 64 y.o.   MRN: [id number]  HPI 64 year old patient who has a history of PSVT and also a prior history of thyroiditis a few years ago.  This was associated with self-limiting.  Hyperthyroidism.  For the past 3 years TSHs have been normal.  His last TSH was approximately 8 months ago.  He is doing well today without concerns or complaints.  Past Medical History  Diagnosis Date  . PSVT   . Palpitations   . Anxiety   . Papilledema     History   Social History  . Marital Status: Married    Spouse Name: N/A  . Number of Children: N/A  . Years of Education: N/A   Occupational History  . Artist    Social History Main Topics  . Smoking status: Never Smoker   . Smokeless tobacco: Never Used  . Alcohol Use: Yes     Comment: occasionaly  . Drug Use: No  . Sexual Activity: Not on file   Other Topics Concern  . Not on file   Social History Narrative    Past Surgical History  Procedure Laterality Date  . No prior surgery      Family History  Problem Relation Age of Onset  . Lung cancer Father   . Cancer Father     lung  . Cancer Mother     colon  . Hypertension Mother   . Colon cancer Mother   . Colon polyps Sister   . Colon cancer Maternal Uncle     Allergies  Allergen Reactions  . Aspirin Swelling  . Penicillins     Per pt: unknown    Current Outpatient Prescriptions on File Prior to Visit  Medication Sig Dispense Refill  . atenolol (TENORMIN) 50 MG tablet Take 1 tablet (50 mg total) by mouth daily. (Patient taking differently: Take 50 mg by mouth as needed. ) 30 tablet 2   No current facility-administered medications on file prior to visit.    BP 120/76 mmHg  Pulse 75  Temp(Src) 98 F (36.7 C) (Oral)  Resp 20  Ht 5\' 11"  (1.803 m)  Wt 150 lb (68.04 kg)  BMI 20.93 kg/m2  SpO2 99%      Review of Systems  Constitutional: Negative for fever, chills, appetite change and fatigue.    HENT: Negative for congestion, dental problem, ear pain, hearing loss, sore throat, tinnitus, trouble swallowing and voice change.   Eyes: Negative for pain, discharge and visual disturbance.  Respiratory: Negative for cough, chest tightness, wheezing and stridor.   Cardiovascular: Negative for chest pain, palpitations and leg swelling.  Gastrointestinal: Negative for nausea, vomiting, abdominal pain, diarrhea, constipation, blood in stool and abdominal distention.  Genitourinary: Negative for urgency, hematuria, flank pain, discharge, difficulty urinating and genital sores.  Musculoskeletal: Negative for myalgias, back pain, joint swelling, arthralgias, gait problem and neck stiffness.  Skin: Negative for rash.  Neurological: Negative for dizziness, syncope, speech difficulty, weakness, numbness and headaches.  Hematological: Negative for adenopathy. Does not bruise/bleed easily.  Psychiatric/Behavioral: Negative for behavioral problems and dysphoric mood. The patient is not nervous/anxious.        Objective:   Physical Exam  Constitutional: He is oriented to person, place, and time. He appears well-developed.  HENT:  Head: Normocephalic.  Right Ear: External ear normal.  Left Ear: External ear normal.  Eyes: Conjunctivae and EOM are normal.  Neck: Normal range of  motion.  Cardiovascular: Normal rate, regular rhythm and normal heart sounds.   Pulmonary/Chest: Breath sounds normal.  Abdominal: Bowel sounds are normal.  Musculoskeletal: Normal range of motion. He exhibits no edema or tenderness.  Neurological: He is alert and oriented to person, place, and time.  Psychiatric: He has a normal mood and affect. His behavior is normal.          Assessment & Plan:   History of PSVT History of thyroiditis.  Will check a TSH  Schedule CPX at his convenience

## 2014-10-30 NOTE — Patient Instructions (Signed)
It is important that you exercise regularly, at least 20 minutes 3 to 4 times per week.  If you develop chest pain or shortness of breath seek  medical attention.  Return in 6 months for follow-up  

## 2014-10-31 LAB — TSH: TSH: 1.38 u[IU]/mL (ref 0.35–4.50)

## 2015-02-16 HISTORY — PX: OTHER SURGICAL HISTORY: SHX169

## 2015-03-03 ENCOUNTER — Ambulatory Visit (INDEPENDENT_AMBULATORY_CARE_PROVIDER_SITE_OTHER): Payer: Managed Care, Other (non HMO) | Admitting: Family Medicine

## 2015-03-03 VITALS — BP 120/70 | HR 68 | Temp 98.6°F | Resp 12 | Ht 70.0 in | Wt 145.0 lb

## 2015-03-03 DIAGNOSIS — R58 Hemorrhage, not elsewhere classified: Secondary | ICD-10-CM | POA: Diagnosis not present

## 2015-03-03 NOTE — Progress Notes (Signed)
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 His wife is a Marine scientist and she recommended he come in to make sure he did not have Lyme disease or leukemia.  Patient has no recollection of any trauma to the left arm. He was lifting a ladder 3 days ago which involves some heavy lifting.  Patient had a cardiac ablation procedure several weeks ago and had one dose of heparin and was told that would be out of his system shortly thereafter. He's not taking any anticoagulation now.  Patient's having no pain or swelling in the affected area. Patient is able to carry out all ADLs without any difficulty.  Objective: No acute distress, articulate BP 120/70 mmHg  Pulse 68  Temp(Src) 98.6 F (37 C) (Oral)  Resp 12  Ht 5\' 10"  (1.778 m)  Wt 145 lb (65.772 kg)  BMI 20.81 kg/m2  SpO2 99% Examination left forearm reveals a 5 cm area of green ecchymosis with central mild erythema, no tenderness. His range of motion is normal with this left forearm and he has no swelling.  Assessment: Ecchymotic area, presumed secondary to heavy lifting several days ago. This should resolve without any further treatment.  Signed, Robyn Haber

## 2015-04-03 HISTORY — PX: NASAL SINUS SURGERY: SHX719

## 2015-05-06 ENCOUNTER — Encounter: Payer: Self-pay | Admitting: Internal Medicine

## 2015-06-12 ENCOUNTER — Ambulatory Visit (INDEPENDENT_AMBULATORY_CARE_PROVIDER_SITE_OTHER): Payer: Managed Care, Other (non HMO) | Admitting: Internal Medicine

## 2015-06-12 ENCOUNTER — Encounter: Payer: Self-pay | Admitting: Internal Medicine

## 2015-06-12 VITALS — BP 110/70 | HR 68 | Temp 98.4°F | Resp 20 | Ht 70.0 in | Wt 146.0 lb

## 2015-06-12 DIAGNOSIS — G4700 Insomnia, unspecified: Secondary | ICD-10-CM | POA: Diagnosis not present

## 2015-06-12 DIAGNOSIS — I480 Paroxysmal atrial fibrillation: Secondary | ICD-10-CM

## 2015-06-12 DIAGNOSIS — J339 Nasal polyp, unspecified: Secondary | ICD-10-CM

## 2015-06-12 MED ORDER — ZOLPIDEM TARTRATE 5 MG PO TABS: 5.0000 mg | ORAL_TABLET | Freq: Every evening | ORAL | Status: DC | PRN

## 2015-06-12 NOTE — Progress Notes (Signed)
Pre visit review using our clinic review tool, if applicable. No additional management support is needed unless otherwise documented below in the visit note. 

## 2015-06-12 NOTE — Progress Notes (Signed)
Subjective:    Patient ID: Eric Kaufman, male    DOB: Jul 15, 1951, 64 y.o.   MRN: [id number]  HPI 64 year old patient who is seen today with a chief complaint of insomnia.  He is scheduled for a sleep study next month.  He is followed by ENT and cardiology.  He is status post ablation for PAF.  He states that sleep studies in the past have revealed mild OSA.  He states that he has had difficulties over the last 10 days.  He feels that he starts snoring and wakes himself up prior to deep sleep  Past Medical History  Diagnosis Date  . PSVT   . Palpitations   . Anxiety   . Papilledema     Social History   Social History  . Marital Status: Married    Spouse Name: N/A  . Number of Children: N/A  . Years of Education: N/A   Occupational History  . Artist    Social History Main Topics  . Smoking status: Never Smoker   . Smokeless tobacco: Never Used  . Alcohol Use: Yes     Comment: occasionaly  . Drug Use: No  . Sexual Activity: Not on file   Other Topics Concern  . Not on file   Social History Narrative    Past Surgical History  Procedure Laterality Date  . No prior surgery    . Heart ablation N/A 02/16/2015  . Nasal sinus surgery  04/03/2015    Family History  Problem Relation Age of Onset  . Lung cancer Father   . Cancer Father     lung  . Cancer Mother     colon  . Hypertension Mother   . Colon cancer Mother   . Colon polyps Sister   . Colon cancer Maternal Uncle     Allergies  Allergen Reactions  . Aspirin Swelling  . Penicillins     Per pt: unknown    No current outpatient prescriptions on file prior to visit.   No current facility-administered medications on file prior to visit.    BP 110/70 mmHg  Pulse 68  Temp(Src) 98.4 F (36.9 C) (Oral)  Resp 20  Ht 5\' 10"  (1.778 m)  Wt 146 lb (66.225 kg)  BMI 20.95 kg/m2  SpO2 98%     Review of Systems  Constitutional: Negative for fever, chills, appetite change and fatigue.  HENT:  Negative for congestion, dental problem, ear pain, hearing loss, sore throat, tinnitus, trouble swallowing and voice change.   Eyes: Negative for pain, discharge and visual disturbance.  Respiratory: Negative for cough, chest tightness, wheezing and stridor.   Cardiovascular: Negative for chest pain, palpitations and leg swelling.  Gastrointestinal: Negative for nausea, vomiting, abdominal pain, diarrhea, constipation, blood in stool and abdominal distention.  Genitourinary: Negative for urgency, hematuria, flank pain, discharge, difficulty urinating and genital sores.  Musculoskeletal: Negative for myalgias, back pain, joint swelling, arthralgias, gait problem and neck stiffness.  Skin: Negative for rash.  Neurological: Negative for dizziness, syncope, speech difficulty, weakness, numbness and headaches.  Hematological: Negative for adenopathy. Does not bruise/bleed easily.  Psychiatric/Behavioral: Positive for sleep disturbance. Negative for behavioral problems and dysphoric mood. The patient is not nervous/anxious.        Objective:   Physical Exam  Constitutional: He appears well-developed and well-nourished.  Blood pressure low normal  HENT:  Significant pharyngeal crowding with low hanging soft palate          Assessment & Plan:  Insomnia.  Sleep hygiene issues discussed.  Will give a prescription for when necessary use of Ambien.  Follow-up sleep study as scheduled Status post ablation for PAF, stable

## 2015-06-12 NOTE — Patient Instructions (Signed)
Insomnia Insomnia is frequent trouble falling and/or staying asleep. Insomnia can be a long term problem or a short term problem. Both are common. Insomnia can be a short term problem when the wakefulness is related to a certain stress or worry. Long term insomnia is often related to ongoing stress during waking hours and/or poor sleeping habits. Overtime, sleep deprivation itself can make the problem worse. Every little thing feels more severe because you are overtired and your ability to cope is decreased. CAUSES   Stress, anxiety, and depression.  Poor sleeping habits.  Distractions such as TV in the bedroom.  Naps close to bedtime.  Engaging in emotionally charged conversations before bed.  Technical reading before sleep.  Alcohol and other sedatives. They may make the problem worse. They can hurt normal sleep patterns and normal dream activity.  Stimulants such as caffeine for several hours prior to bedtime.  Pain syndromes and shortness of breath can cause insomnia.  Exercise late at night.  Changing time zones may cause sleeping problems (jet lag). It is sometimes helpful to have someone observe your sleeping patterns. They should look for periods of not breathing during the night (sleep apnea). They should also look to see how long those periods last. If you live alone or observers are uncertain, you can also be observed at a sleep clinic where your sleep patterns will be professionally monitored. Sleep apnea requires a checkup and treatment. Give your caregivers your medical history. Give your caregivers observations your family has made about your sleep.  SYMPTOMS   Not feeling rested in the morning.  Anxiety and restlessness at bedtime.  Difficulty falling and staying asleep. TREATMENT   Your caregiver may prescribe treatment for an underlying medical disorders. Your caregiver can give advice or help if you are using alcohol or other drugs for self-medication. Treatment  of underlying problems will usually eliminate insomnia problems.  Medications can be prescribed for short time use. They are generally not recommended for lengthy use.  Over-the-counter sleep medicines are not recommended for lengthy use. They can be habit forming.  You can promote easier sleeping by making lifestyle changes such as:  Using relaxation techniques that help with breathing and reduce muscle tension.  Exercising earlier in the day.  Changing your diet and the time of your last meal. No night time snacks.  Establish a regular time to go to bed.  Counseling can help with stressful problems and worry.  Soothing music and white noise may be helpful if there are background noises you cannot remove.  Stop tedious detailed work at least one hour before bedtime. HOME CARE INSTRUCTIONS   Keep a diary. Inform your caregiver about your progress. This includes any medication side effects. See your caregiver regularly. Take note of:  Times when you are asleep.  Times when you are awake during the night.  The quality of your sleep.  How you feel the next day. This information will help your caregiver care for you.  Get out of bed if you are still awake after 15 minutes. Read or do some quiet activity. Keep the lights down. Wait until you feel sleepy and go back to bed.  Keep regular sleeping and waking hours. Avoid naps.  Exercise regularly.  Avoid distractions at bedtime. Distractions include watching television or engaging in any intense or detailed activity like attempting to balance the household checkbook.  Develop a bedtime ritual. Keep a familiar routine of bathing, brushing your teeth, climbing into bed at the same   time each night, listening to soothing music. Routines increase the success of falling to sleep faster.  Use relaxation techniques. This can be using breathing and muscle tension release routines. It can also include visualizing peaceful scenes. You can  also help control troubling or intruding thoughts by keeping your mind occupied with boring or repetitive thoughts like the old concept of counting sheep. You can make it more creative like imagining planting one beautiful flower after another in your backyard garden.  During your day, work to eliminate stress. When this is not possible use some of the previous suggestions to help reduce the anxiety that accompanies stressful situations. MAKE SURE YOU:   Understand these instructions.  Will watch your condition.  Will get help right away if you are not doing well or get worse. Document Released: 08/22/2000 Document Revised: 11/17/2011 Document Reviewed: 09/22/2007 [employer] Patient Information 2015 [employer]. This information is not intended to replace advice given to you by your health care provider. Make sure you discuss any questions you have with your health care provider.  

## 2015-11-27 ENCOUNTER — Encounter: Payer: Self-pay | Admitting: Gastroenterology

## 2015-12-12 ENCOUNTER — Encounter: Payer: Self-pay | Admitting: Gastroenterology

## 2015-12-12 NOTE — Telephone Encounter (Signed)
Error

## 2015-12-17 ENCOUNTER — Ambulatory Visit (INDEPENDENT_AMBULATORY_CARE_PROVIDER_SITE_OTHER): Payer: Managed Care, Other (non HMO) | Admitting: Internal Medicine

## 2015-12-17 ENCOUNTER — Encounter: Payer: Self-pay | Admitting: Internal Medicine

## 2015-12-17 VITALS — BP 100/70 | HR 94 | Temp 98.0°F | Resp 20 | Ht 70.0 in | Wt 154.0 lb

## 2015-12-17 DIAGNOSIS — J40 Bronchitis, not specified as acute or chronic: Secondary | ICD-10-CM | POA: Diagnosis not present

## 2015-12-17 DIAGNOSIS — J339 Nasal polyp, unspecified: Secondary | ICD-10-CM | POA: Diagnosis not present

## 2015-12-17 NOTE — Patient Instructions (Signed)
Acute bronchitis symptoms for less than 10 days are generally not helped by antibiotics.  Take over-the-counter expectorants and cough medications such as  Mucinex DM.  Call if there is no improvement in 5 to 7 days or if  you develop worsening cough, fever, or new symptoms, such as shortness of breath or chest pain.   

## 2015-12-17 NOTE — Progress Notes (Signed)
Pre visit review using our clinic review tool, if applicable. No additional management support is needed unless otherwise documented below in the visit note. 

## 2015-12-17 NOTE — Progress Notes (Signed)
Subjective:    Patient ID: Eric Kaufman, male    DOB: 1951-07-09, 65 y.o.   MRN: [id number]  HPI  65 year old patient who presents with a 4-5 day history of chest congestion and cough.  Cough and congestion.  More bothersome in the early morning hours that affects sleep.  He awakes at 4 to 5:00.  Due to the congested cough and does much better throughout the day.  Cough is mainly productive.  No fever, shortness of breath or wheezing.  He does have a history of OSA and does use CPAP.  He was concerned about cough been related to his CPAP equipment.  He is careful with cleansing weekly  Past Medical History  Diagnosis Date  . PSVT   . Palpitations   . Anxiety   . Papilledema     Social History   Social History  . Marital Status: Married    Spouse Name: N/A  . Number of Children: N/A  . Years of Education: N/A   Occupational History  . Artist    Social History Main Topics  . Smoking status: Never Smoker   . Smokeless tobacco: Never Used  . Alcohol Use: Yes     Comment: occasionaly  . Drug Use: No  . Sexual Activity: Not on file   Other Topics Concern  . Not on file   Social History Narrative    Past Surgical History  Procedure Laterality Date  . No prior surgery    . Heart ablation N/A 02/16/2015  . Nasal sinus surgery  04/03/2015    Family History  Problem Relation Age of Onset  . Lung cancer Father   . Cancer Father     lung  . Cancer Mother     colon  . Hypertension Mother   . Colon cancer Mother   . Colon polyps Sister   . Colon cancer Maternal Uncle     Allergies  Allergen Reactions  . Aspirin Swelling  . Penicillins     Per pt: unknown    Current Outpatient Prescriptions on File Prior to Visit  Medication Sig Dispense Refill  . zolpidem (AMBIEN) 5 MG tablet Take 1 tablet (5 mg total) by mouth at bedtime as needed for sleep. (Patient not taking: Reported on 12/17/2015) 30 tablet 0   No current facility-administered medications on file prior  to visit.    BP 100/70 mmHg  Pulse 94  Temp(Src) 98 F (36.7 C) (Oral)  Resp 20  Ht 5\' 10"  (1.778 m)  Wt 154 lb (69.854 kg)  BMI 22.10 kg/m2  SpO2 97%     Review of Systems  Constitutional: Negative for fever, chills, appetite change and fatigue.  HENT: Negative for congestion, dental problem, ear pain, hearing loss, sore throat, tinnitus, trouble swallowing and voice change.   Eyes: Negative for pain, discharge and visual disturbance.  Respiratory: Positive for cough. Negative for chest tightness, wheezing and stridor.   Cardiovascular: Negative for chest pain, palpitations and leg swelling.  Gastrointestinal: Negative for nausea, vomiting, abdominal pain, diarrhea, constipation, blood in stool and abdominal distention.  Genitourinary: Negative for urgency, hematuria, flank pain, discharge, difficulty urinating and genital sores.  Musculoskeletal: Negative for myalgias, back pain, joint swelling, arthralgias, gait problem and neck stiffness.  Skin: Negative for rash.  Neurological: Negative for dizziness, syncope, speech difficulty, weakness, numbness and headaches.  Hematological: Negative for adenopathy. Does not bruise/bleed easily.  Psychiatric/Behavioral: Negative for behavioral problems and dysphoric mood. The patient is not nervous/anxious.  Objective:   Physical Exam  Constitutional: He is oriented to person, place, and time. He appears well-developed.  HENT:  Head: Normocephalic.  Right Ear: External ear normal.  Left Ear: External ear normal.  Eyes: Conjunctivae and EOM are normal.  Neck: Normal range of motion.  Cardiovascular: Normal rate and normal heart sounds.   Pulmonary/Chest: Effort normal and breath sounds normal. No respiratory distress. He has no wheezes. He has no rales.  Abdominal: Bowel sounds are normal.  Musculoskeletal: Normal range of motion. He exhibits no edema or tenderness.  Neurological: He is alert and oriented to person, place,  and time.  Psychiatric: He has a normal mood and affect. His behavior is normal.          Assessment & Plan:   Subacute viral bronchitis.  Chest today is clear.  Will force fluids and continue mucolytic's Will call if unimproved

## 2015-12-25 ENCOUNTER — Ambulatory Visit (INDEPENDENT_AMBULATORY_CARE_PROVIDER_SITE_OTHER): Payer: Managed Care, Other (non HMO) | Admitting: Internal Medicine

## 2015-12-25 ENCOUNTER — Encounter: Payer: Self-pay | Admitting: Internal Medicine

## 2015-12-25 VITALS — BP 100/60 | HR 62 | Temp 98.2°F | Resp 20 | Ht 70.0 in | Wt 154.0 lb

## 2015-12-25 DIAGNOSIS — J209 Acute bronchitis, unspecified: Secondary | ICD-10-CM

## 2015-12-25 DIAGNOSIS — J339 Nasal polyp, unspecified: Secondary | ICD-10-CM

## 2015-12-25 MED ORDER — AZITHROMYCIN 250 MG PO TABS: ORAL_TABLET | ORAL | Status: DC

## 2015-12-25 NOTE — Progress Notes (Signed)
Subjective:    Patient ID: Eric Kaufman, male    DOB: Aug 23, 1951, 65 y.o.   MRN: [id number]  HPI   65 year old patient who is seen today in follow-up with the chief complaint of cough now of greater than 2 weeks duration. He has been using Robitussin with some benefit. Cough is mainly productive and often yielding thick green sputum. He generally feels well without fever or other constitutional complaints.  He has a history of OSA requiring CPAP. He has not been able to use CPAP effectively due to the irritant effects and worsening cough.  He is also concerned about termination of insurance coverage due to nonuse  he has minimal chronic postnasal drip  Past Medical History  Diagnosis Date  . PSVT   . Palpitations   . Anxiety   . Papilledema      Social History   Social History  . Marital Status: Married    Spouse Name: N/A  . Number of Children: N/A  . Years of Education: N/A   Occupational History  . Artist    Social History Main Topics  . Smoking status: Never Smoker   . Smokeless tobacco: Never Used  . Alcohol Use: Yes     Comment: occasionaly  . Drug Use: No  . Sexual Activity: Not on file   Other Topics Concern  . Not on file   Social History Narrative    Past Surgical History  Procedure Laterality Date  . No prior surgery    . Heart ablation N/A 02/16/2015  . Nasal sinus surgery  04/03/2015    Family History  Problem Relation Age of Onset  . Lung cancer Father   . Cancer Father     lung  . Cancer Mother     colon  . Hypertension Mother   . Colon cancer Mother   . Colon polyps Sister   . Colon cancer Maternal Uncle     Allergies  Allergen Reactions  . Aspirin Swelling  . Penicillins     Per pt: unknown    No current outpatient prescriptions on file prior to visit.   No current facility-administered medications on file prior to visit.    BP 100/60 mmHg  Pulse 62  Temp(Src) 98.2 F (36.8 C) (Oral)  Resp 20  Ht 5\' 10"  (1.778 m)  Wt  154 lb (69.854 kg)  BMI 22.10 kg/m2  SpO2 99%     Review of Systems  Constitutional: Negative for fever, chills, appetite change and fatigue.  HENT: Negative for congestion, dental problem, ear pain, hearing loss, sore throat, tinnitus, trouble swallowing and voice change.   Eyes: Negative for pain, discharge and visual disturbance.  Respiratory: Positive for cough. Negative for chest tightness, wheezing and stridor.   Cardiovascular: Negative for chest pain, palpitations and leg swelling.  Gastrointestinal: Negative for nausea, vomiting, abdominal pain, diarrhea, constipation, blood in stool and abdominal distention.  Genitourinary: Negative for urgency, hematuria, flank pain, discharge, difficulty urinating and genital sores.  Musculoskeletal: Negative for myalgias, back pain, joint swelling, arthralgias, gait problem and neck stiffness.  Skin: Negative for rash.  Neurological: Negative for dizziness, syncope, speech difficulty, weakness, numbness and headaches.  Hematological: Negative for adenopathy. Does not bruise/bleed easily.  Psychiatric/Behavioral: Negative for behavioral problems and dysphoric mood. The patient is not nervous/anxious.        Objective:   Physical Exam  Constitutional: He is oriented to person, place, and time. He appears well-developed.  HENT:  Head: Normocephalic.  Right  Ear: External ear normal.  Left Ear: External ear normal.  Eyes: Conjunctivae and EOM are normal.  Neck: Normal range of motion.  Cardiovascular: Normal rate and normal heart sounds.   Pulmonary/Chest: Effort normal and breath sounds normal. No respiratory distress. He has no wheezes. He has no rales.  Abdominal: Bowel sounds are normal.  Musculoskeletal: Normal range of motion. He exhibits no edema or tenderness.  Neurological: He is alert and oriented to person, place, and time.  Psychiatric: He has a normal mood and affect. His behavior is normal.          Assessment & Plan:     cough.  Probably secondary to resolving viral tracheobronchitis.  Discussed at length.  Patient information supplied;  he was told that antibiotics probably would not be very helpful.

## 2015-12-25 NOTE — Patient Instructions (Signed)
Acute bronchitis symptoms  are generally not helped by antibiotics.  Take over-the-counter expectorants and cough medications such as  Mucinex DM.  Call if there is no improvement in 5 to 7 days or if  you develop worsening cough, fever, or new symptoms, such as shortness of breath or chest pain.  Acute Bronchitis Bronchitis is inflammation of the airways that extend from the windpipe into the lungs (bronchi). The inflammation often causes mucus to develop. This leads to a cough, which is the most common symptom of bronchitis.  In acute bronchitis, the condition usually develops suddenly and goes away over time, usually in a couple weeks. Smoking, allergies, and asthma can make bronchitis worse. Repeated episodes of bronchitis may cause further lung problems.  CAUSES Acute bronchitis is most often caused by the same virus that causes a cold. The virus can spread from person to person (contagious) through coughing, sneezing, and touching contaminated objects. SIGNS AND SYMPTOMS   Cough.   Fever.   Coughing up mucus.   Body aches.   Chest congestion.   Chills.   Shortness of breath.   Sore throat.  DIAGNOSIS  Acute bronchitis is usually diagnosed through a physical exam. Your health care provider will also ask you questions about your medical history. Tests, such as chest X-rays, are sometimes done to rule out other conditions.  TREATMENT  Acute bronchitis usually goes away in a couple weeks. Oftentimes, no medical treatment is necessary. Medicines are sometimes given for relief of fever or cough. Antibiotic medicines are usually not needed but may be prescribed in certain situations. In some cases, an inhaler may be recommended to help reduce shortness of breath and control the cough. A cool mist vaporizer may also be used to help thin bronchial secretions and make it easier to clear the chest.  HOME CARE INSTRUCTIONS  Get plenty of rest.   Drink enough fluids to keep your  urine clear or pale yellow (unless you have a medical condition that requires fluid restriction). Increasing fluids may help thin your respiratory secretions (sputum) and reduce chest congestion, and it will prevent dehydration.   Take medicines only as directed by your health care provider.  If you were prescribed an antibiotic medicine, finish it all even if you start to feel better.  Avoid smoking and secondhand smoke. Exposure to cigarette smoke or irritating chemicals will make bronchitis worse. If you are a smoker, consider using nicotine gum or skin patches to help control withdrawal symptoms. Quitting smoking will help your lungs heal faster.   Reduce the chances of another bout of acute bronchitis by washing your hands frequently, avoiding people with cold symptoms, and trying not to touch your hands to your mouth, nose, or eyes.   Keep all follow-up visits as directed by your health care provider.  SEEK MEDICAL CARE IF: Your symptoms do not improve after 1 week of treatment.  SEEK IMMEDIATE MEDICAL CARE IF:  You develop an increased fever or chills.   You have chest pain.   You have severe shortness of breath.  You have bloody sputum.   You develop dehydration.  You faint or repeatedly feel like you are going to pass out.  You develop repeated vomiting.  You develop a severe headache. MAKE SURE YOU:   Understand these instructions.  Will watch your condition.  Will get help right away if you are not doing well or get worse.   This information is not intended to replace advice given to you by  your health care provider. Make sure you discuss any questions you have with your health care provider.   Document Released: 10/02/2004 Document Revised: 09/15/2014 Document Reviewed: 02/15/2013 Elsevier Interactive Patient Education 2016 Else[employer] Cough, Adult Coughing is a reflex that clears your throat and your airways. Coughing helps to heal and protect your  lungs. It is normal to cough occasionally, but a cough that happens with other symptoms or lasts a long time may be a sign of a condition that needs treatment. A cough may last only 2-3 weeks (acute), or it may last longer than 8 weeks (chronic). CAUSES Coughing is commonly caused by:  Breathing in substances that irritate your lungs.  A viral or bacterial respiratory infection.  Allergies.  Asthma.  Postnasal drip.  Smoking.  Acid backing up from the stomach into the esophagus (gastroesophageal reflux).  Certain medicines.  Chronic lung problems, including COPD (or rarely, lung cancer).  Other medical conditions such as heart failure. HOME CARE INSTRUCTIONS  Pay attention to any changes in your symptoms. Take these actions to help with your discomfort:  Take medicines only as told by your health care provider.  If you were prescribed an antibiotic medicine, take it as told by your health care provider. Do not stop taking the antibiotic even if you start to feel better.  Talk with your health care provider before you take a cough suppressant medicine.  Drink enough fluid to keep your urine clear or pale yellow.  If the air is dry, use a cold steam vaporizer or humidifier in your bedroom or your home to help loosen secretions.  Avoid anything that causes you to cough at work or at home.  If your cough is worse at night, try sleeping in a semi-upright position.  Avoid cigarette smoke. If you smoke, quit smoking. If you need help quitting, ask your health care provider.  Avoid caffeine.  Avoid alcohol.  Rest as needed. SEEK MEDICAL CARE IF:   You have new symptoms.  You cough up pus.  Your cough does not get better after 2-3 weeks, or your cough gets worse.  You cannot control your cough with suppressant medicines and you are losing sleep.  You develop pain that is getting worse or pain that is not controlled with pain medicines.  You have a fever.  You have  unexplained weight loss.  You have night sweats. SEEK IMMEDIATE MEDICAL CARE IF:  You cough up blood.  You have difficulty breathing.  Your heartbeat is very fast.   This information is not intended to replace advice given to you by your health care provider. Make sure you discuss any questions you have with your health care provider.   Document Released: 02/21/2011 Document Revised: 05/16/2015 Document Reviewed: 11/01/2014 Elsevier Interactive Patient Education [employer].

## 2015-12-25 NOTE — Progress Notes (Signed)
Pre visit review using our clinic review tool, if applicable. No additional management support is needed unless otherwise documented below in the visit note. 

## 2016-01-02 ENCOUNTER — Ambulatory Visit: Payer: Managed Care, Other (non HMO) | Admitting: Family Medicine

## 2016-01-10 ENCOUNTER — Encounter (HOSPITAL_COMMUNITY): Payer: Self-pay

## 2016-01-10 ENCOUNTER — Emergency Department (HOSPITAL_COMMUNITY)
Admission: EM | Admit: 2016-01-10 | Discharge: 2016-01-10 | Disposition: A | Discharge status: Home / Self Care | Payer: Managed Care, Other (non HMO) | Attending: Emergency Medicine | Admitting: Emergency Medicine

## 2016-01-10 ENCOUNTER — Emergency Department (HOSPITAL_COMMUNITY): Payer: Managed Care, Other (non HMO)

## 2016-01-10 DIAGNOSIS — Z8669 Personal history of other diseases of the nervous system and sense organs: Secondary | ICD-10-CM | POA: Diagnosis not present

## 2016-01-10 DIAGNOSIS — Z880 Allergy status to penicillin: Secondary | ICD-10-CM | POA: Insufficient documentation

## 2016-01-10 DIAGNOSIS — F419 Anxiety disorder, unspecified: Secondary | ICD-10-CM | POA: Diagnosis not present

## 2016-01-10 DIAGNOSIS — R002 Palpitations: Secondary | ICD-10-CM | POA: Diagnosis present

## 2016-01-10 DIAGNOSIS — I480 Paroxysmal atrial fibrillation: Secondary | ICD-10-CM | POA: Diagnosis not present

## 2016-01-10 DIAGNOSIS — Z792 Long term (current) use of antibiotics: Secondary | ICD-10-CM | POA: Diagnosis not present

## 2016-01-10 DIAGNOSIS — J449 Chronic obstructive pulmonary disease, unspecified: Secondary | ICD-10-CM | POA: Diagnosis not present

## 2016-01-10 DIAGNOSIS — I471 Supraventricular tachycardia: Secondary | ICD-10-CM | POA: Diagnosis not present

## 2016-01-10 LAB — CBC
HCT: 51.1 % (ref 39.0–52.0)
HEMOGLOBIN: 16.8 g/dL (ref 13.0–17.0)
MCH: 30.9 pg (ref 26.0–34.0)
MCHC: 32.9 g/dL (ref 30.0–36.0)
MCV: 94.1 fL (ref 78.0–100.0)
PLATELETS: 324 10*3/uL (ref 150–400)
RBC: 5.43 MIL/uL (ref 4.22–5.81)
RDW: 12.9 % (ref 11.5–15.5)
WBC: 12.1 10*3/uL — ABNORMAL HIGH (ref 4.0–10.5)

## 2016-01-10 LAB — BASIC METABOLIC PANEL
ANION GAP: 12 (ref 5–15)
BUN: 19 mg/dL (ref 6–20)
CALCIUM: 9.2 mg/dL (ref 8.9–10.3)
CO2: 28 mmol/L (ref 22–32)
Chloride: 99 mmol/L — ABNORMAL LOW (ref 101–111)
Creatinine, Ser: 0.8 mg/dL (ref 0.61–1.24)
GFR calc Af Amer: >60 mL/min (ref >60)
GFR calc non Af Amer: >60 mL/min (ref >60)
GLUCOSE: 101 mg/dL — AB (ref 65–99)
Potassium: 4.4 mmol/L (ref 3.5–5.1)
Sodium: 139 mmol/L (ref 135–145)

## 2016-01-10 LAB — I-STAT TROPONIN, ED: Troponin i, poc: 0 ng/mL (ref 0.00–0.08)

## 2016-01-10 NOTE — Discharge Instructions (Signed)

## 2016-01-10 NOTE — ED Notes (Signed)
Pt verbalizes understanding of instructions. 

## 2016-01-10 NOTE — ED Notes (Signed)
Patient here with palpitations since am, history of same. Has had ablation in past. No chest pain. Currently taking steroids for bronchial infection. Patient with no other associated symptoms. No distress.

## 2016-01-10 NOTE — ED Provider Notes (Signed)
CSN: [id number]     Arrival date & time 01/10/16  1114 History   First MD Initiated Contact with Patient 01/10/16 1419     Chief Complaint  Patient presents with  . Palpitations     (Consider location/radiation/quality/duration/timing/severity/associated sxs/prior Treatment) HPI 65 y.o. male with a hx of PSVT, s/p ablation, paroxysmal atrial fib, and COPD presents to the ED noting a return of his intermittent palpitation sx since starting a prednisone taper for bronchitis sx. His bronchitis sx have resolved but his palps have been more frequent and lasting longer than they normally do. He has had no associated chest pain, diaphoresis, nausea, vomiting, abdominal pain, chest tightness, light-headedness. He has been taking his rx'd PRN atenolol to good effect on his sx intermittently, but today he states that it has not worked to convert him back to NSR. He has been advised that his risk factors are so low that he does not need to be anticoagulated at this time.  He denies any hx of thyroid disease or supplementation, denies any OTC cold remedies, or increased caffeine intake. He states that his palpitation sx are identical to that which he has had before.   Past Medical History  Diagnosis Date  . PSVT   . Palpitations   . Anxiety   . Papilledema    Past Surgical History  Procedure Laterality Date  . No prior surgery    . Heart ablation N/A 02/16/2015  . Nasal sinus surgery  04/03/2015   Family History  Problem Relation Age of Onset  . Lung cancer Father   . Cancer Father     lung  . Cancer Mother     colon  . Hypertension Mother   . Colon cancer Mother   . Colon polyps Sister   . Colon cancer Maternal Uncle    Social History  Substance Use Topics  . Smoking status: Never Smoker   . Smokeless tobacco: Never Used  . Alcohol Use: Yes     Comment: occasionaly    Review of Systems  Constitutional: Negative for fever, chills, diaphoresis, activity change and appetite change.   HENT: Negative for congestion, rhinorrhea, sinus pressure and sneezing.   Respiratory: Negative for cough, chest tightness and shortness of breath.   Cardiovascular: Positive for palpitations. Negative for chest pain.  Gastrointestinal: Negative for nausea, vomiting and abdominal pain.  Musculoskeletal: Negative for back pain and neck pain.  Skin: Negative for rash.  Neurological: Negative for dizziness, facial asymmetry, speech difficulty, weakness, light-headedness, numbness and headaches.  All other systems reviewed and are negative.     Allergies  Aspirin and Penicillins  Home Medications   Prior to Admission medications   Medication Sig Start Date End Date Taking? Authorizing Provider  atenolol (TENORMIN) 50 MG tablet Take 50 mg by mouth as needed (palpatations).   Yes Historical Provider, MD  levofloxacin (LEVAQUIN) 750 MG tablet Take 750 mg by mouth. 01/01/16 01/15/16 Yes Historical Provider, MD  predniSONE (STERAPRED UNI-PAK 21 TAB) 10 MG (21) TBPK tablet Take by mouth. 01/01/16 01/13/16 Yes Historical Provider, MD  Pseudoephedrine-DM-GG (ROBITUSSIN COLD & COUGH PO) Take 10 mLs by mouth as needed.   Yes Historical Provider, MD  azithromycin (ZITHROMAX) 250 MG tablet 2 tablets once daily for 3 consecutive days 12/25/15   Marletta Lor, MD   BP 114/85 mmHg  Pulse 78  Temp(Src) 98.3 F (36.8 C) (Oral)  Resp 13  Ht 5\' 11"  (1.803 m)  Wt 68.947 kg  BMI 21.21 kg/m2  SpO2 98% Physical Exam  Constitutional: He is oriented to person, place, and time. He appears well-developed and well-nourished. No distress.  HENT:  Head: Normocephalic and atraumatic.  Nose: Nose normal.  Mouth/Throat: Oropharynx is clear and moist.  Eyes: Conjunctivae and EOM are normal. Pupils are equal, round, and reactive to light.  Neck: Neck supple.  Cardiovascular: Normal rate and intact distal pulses.  An irregularly irregular rhythm present.  Pulmonary/Chest: Effort normal and breath sounds normal.  He exhibits no tenderness.  Abdominal: Soft. He exhibits no distension. There is no tenderness.  Musculoskeletal: He exhibits no edema or tenderness.  Neurological: He is alert and oriented to person, place, and time. No cranial nerve deficit. Coordination normal.  Skin: Skin is warm and dry. No rash noted. He is not diaphoretic.  Nursing note and vitals reviewed.   ED Course  Procedures (including critical care time) Labs Review Labs Reviewed  BASIC METABOLIC PANEL - Abnormal; Notable for the following:    Chloride 99 (*)    Glucose, Bld 101 (*)    All other components within normal limits  CBC - Abnormal; Notable for the following:    WBC 12.1 (*)    All other components within normal limits  I-STAT TROPOININ, ED    Imaging Review Dg Chest 2 View  01/10/2016  CLINICAL DATA:  Chest pain and palpitations EXAM: CHEST  2 VIEW COMPARISON:  01/01/2016 FINDINGS: Cardiac shadow is stable. The lungs are hyperinflated consistent with COPD. Mild apical scarring is again noted. No acute bony abnormality is seen. IMPRESSION: COPD without acute abnormality. Electronically Signed   By: Inez Catalina M.D.   On: 01/10/2016 12:05   I have personally reviewed and evaluated these images and lab results as part of my medical decision-making.   EKG Interpretation   Date/Time:  Thursday Jan 10 2016 11:18:34 EDT Ventricular Rate:  99 PR Interval:    QRS Duration: 74 QT Interval:  328 QTC Calculation: 420 R Axis:   -39 Text Interpretation:  Atrial fibrillation Left axis deviation Abnormal ECG  No significant change since last tracing Confirmed by KNOTT MD, Quillian Quince  [id number]) on 01/10/2016 3:33:45 PM      MDM   65 y.o. male with a hx of paroxysmal a. Fib, PSVT, s/p ablation and COPD presents to the ED noting the return of his intermittent palpitations sx after starting a taper of prednisone for bronchitis. He states that gradually his bronchitis sx have significantly improved, but he has noted  increasing frequency and duration of his intermittent palpitations. No associated chest pain or other concerning sx. He has been taking his r'd atenolol as needed intermittently to good effect. On exam he is well appearing in NAD with rate controlled a. Fib noted on EKG and on the bedside monitor. Labs were drawn and returned reassuringly with a negative troponin, normal electrolytes and mildly elevated leukocytosis, likely due to steroids). Feel that he is having a return of his atrial fib sx due to the steroids, which he states he is almost done with (3 days remaining.) Given his reassuring exam with no concerning sx in his hx feel that he is stable to be discharged home to follow up as an outpatient. He has a follow up appointment scheduled soon with his cardiologist to further discuss his sx. He was advised that there would likely be no significant adverse effects to him ending his taper 3 days early, and he was recommended to continue taking his BB as needed for his  palpitation sx. He was recommended to follow up with his PCP and cardiologist for further care and assessment of his sx and strict return precautions were given. This plan was discussed with the patient and his wife at the bedside and they stated both understanding and agreement with this plan.   Final diagnoses:  PAF (paroxysmal atrial fibrillation) ([place])       Zenovia Jarred, DO 01/11/16 2228  Leo Grosser, MD 01/14/16 0111

## 2016-01-12 ENCOUNTER — Ambulatory Visit (INDEPENDENT_AMBULATORY_CARE_PROVIDER_SITE_OTHER): Payer: Managed Care, Other (non HMO) | Admitting: Family Medicine

## 2016-01-12 VITALS — BP 98/66 | HR 100 | Temp 98.4°F | Resp 16 | Ht 71.0 in | Wt 147.0 lb

## 2016-01-12 DIAGNOSIS — I480 Paroxysmal atrial fibrillation: Secondary | ICD-10-CM

## 2016-01-12 MED ORDER — DILTIAZEM HCL 30 MG PO TABS: 30.0000 mg | ORAL_TABLET | Freq: Four times a day (QID) | ORAL | Status: DC

## 2016-01-12 NOTE — Progress Notes (Addendum)
Subjective:    Patient ID: Eric Kaufman, male    DOB: 09-Oct-1950, 65 y.o.   MRN: [id number] By signing my name below, I, Judithe Modest, attest that this documentation has been prepared under the direction and in the presence of Delman Cheadle, MD. Electronically Signed: Judithe Modest, ER Scribe. 01/12/2016. 10:22 AM.  Chief Complaint  Patient presents with  . Atrial Fibrillation    Pt was in ED on 01/10/16    HPI HPI Comments: Eric Kaufman is a 65 y.o. male who presents to [facility] complaining of continued palpitations for the last three weeks, which started after taking steroids due to a URI. He has not been able to use his cpap for the last three weeks due to the URI. When his palpitations started he took 25 mg of atenolol, and his heart rhythm returned to normal. He took atenelol once per day for a week and it worked to normalize his heart rate. He then stopped when his dose of steroids came down and his palpitations came back. He then too 50mg  of atenolol and it did not stop his palpitations so he reported to the ER. His blood pressure falls quite low when he takes atenolol. His resting BP is 105/65, and when he takes atenelol it drops even lower. He has some lightheadedness when he takes the atenolol. He denies chest pain, SOB, or trouble breathing when laying down.   He is followed at [facility] for proxismal Atrial fibrillation, runs of which were triggered by steroids which were prescribed for URI. he is on Atenol. He was offered a cardiology visit on 5/08, which he declined. He has an appointment on 5/12 with Dr. Ola Spurr his cardiologist. He was seen at [facility] on 04/25 and placed on a two week course of Levaquin 750, as well as a 12 day 10mg  prednisone taper for an exacerbation of asthmatic bronchitis. He has a past hx of sleep apnea and chronic sinusitis. His has a pulmonologist, Dr. Camillo Flaming. Chest x-ray at visit was negative. He has undergone ablation for Afib previously.   He  was at [facility] two days prior complaining of intermittent palpitations that his atenolol was not helping. He is not anticoagulated. Pt was reassured in the ER that atenolol is affective for rate control, and he would likely be able to convert back to normal sinus rhythm after completion of the steroid taper, which was anticipated to occur in three days. Troponin was negative in the ER. His white count was mildly elevated secondary to steroids. BMP was normal. Chest x-ray showed COPD.   The pt was hospitalized two years ago on 02/26/2015 for palpitations with HR 98-110 with ambulation. He was seen by cardiology Dr. Ferdie Ping after Cardizem heart rate remained in 90s but pts sx resolved. He was discharged and prescribed Cardizem 30mg  every 6 hours for heart rate over 100.   Past Medical History  Diagnosis Date  . PSVT   . Palpitations   . Anxiety   . Papilledema    Allergies  Allergen Reactions  . Aspirin Swelling  . Penicillins     Per pt: unknown   Current Outpatient Prescriptions on File Prior to Visit  Medication Sig Dispense Refill  . atenolol (TENORMIN) 50 MG tablet Take 50 mg by mouth as needed (palpatations).    Marland Kitchen levofloxacin (LEVAQUIN) 750 MG tablet Take 750 mg by mouth. Reported on 01/12/2016     No current facility-administered medications on file prior to visit.  Review of Systems  Constitutional: Negative for fever and chills.  Respiratory: Negative for chest tightness and shortness of breath.   Cardiovascular: Positive for palpitations. Negative for chest pain.  Psychiatric/Behavioral: Positive for agitation. The patient is nervous/anxious.       Objective:  BP 98/66 mmHg  Pulse 100  Temp(Src) 98.4 F (36.9 C) (Oral)  Resp 16  Ht 5\' 11"  (1.803 m)  Wt 147 lb (66.679 kg)  BMI 20.51 kg/m2  SpO2 99%  Physical Exam  Constitutional: He is oriented to person, place, and time. He appears well-developed and well-nourished. No distress.  HENT:  Head: Normocephalic  and atraumatic.  Eyes: Conjunctivae are normal. Pupils are equal, round, and reactive to light. No scleral icterus.  Neck: Normal range of motion. Neck supple. No thyromegaly present.  Cardiovascular: Normal rate, normal heart sounds and intact distal pulses.  An irregularly irregular rhythm present.  No murmur heard. No swelling in legs.   Pulmonary/Chest: Effort normal and breath sounds normal. No respiratory distress.  lungs clear.  Musculoskeletal: Normal range of motion. He exhibits no edema.  Lymphadenopathy:    He has no cervical adenopathy.  Neurological: He is alert and oriented to person, place, and time. Coordination normal.  Skin: Skin is warm and dry. He is not diaphoretic.  Psychiatric: He has a normal mood and affect. His behavior is normal.  Nursing note and vitals reviewed.    [facility] reading (PRIMARY) by  Dr. Brigitte Pulse. EKG: a. Fib with rate in 90s Assessment & Plan:   1. PAF (paroxysmal atrial fibrillation) (H[facility])   Pt received dilt 30mg  in the ER several yrs ago and reports it flipped him back into sinus - ER note reviewed and documents that pt at least symptomatically improved with this and so agreed to discharge with o/p f/u. Pt is VERY concerned about being left in a. Fib despite repeated reassurance that rate control is a safe management plan for a. Fib. Unfortunately, the ability to rate control is very limited by his hypotension (has a low resting BP at baseline).  Stop atenolol (last dose >12 hrs prior) and start dilt.  If he is not getting symptomatic improvement with this, we could try metoprolol as less bp lowering effect.  He has an appt w/ his cardiologist in 6d. Increase fluids and salt and protein in diet to raise bp.  If he becomes symptomatically hypotensive lay flat and eat/drink.  If sxs do not resolve or becomes presyncopal - 911 and to ER.  Pt and wife agree to plan.  Orders Placed This Encounter  Procedures  . EKG 12-Lead    Meds ordered this encounter    Medications  . diltiazem (CARDIZEM) 30 MG tablet    Sig: Take 1 tablet (30 mg total) by mouth 4 (four) times daily. For pulse >100    Dispense:  20 tablet    Refill:  0    I personally performed the services described in this documentation, which was scribed in my presence. The recorded information has been reviewed and considered, and addended by me as needed.  Delman Cheadle, MD MPH  ADDENDUM: BP 90s/65, pulse is still staying 92-95

## 2016-01-12 NOTE — Patient Instructions (Addendum)
IF you received an x-ray today, you will receive an invoice from [facility]. Please contact [facility] at [phone] with questions or concerns regarding your invoice.   IF you received labwork today, you will receive an invoice from [employer]. Please contact [facility] at [phone] with questions or concerns regarding your invoice.   Our billing staff will not be able to assist you with questions regarding bills from these companies.  You will be contacted with the lab results as soon as they are available. The fastest way to get your results is to activate your My Chart account. Instructions are located on the last page of this paperwork. If you have not heard from [place] regarding the results in 2 weeks, please contact this office.     Hypotension As your heart beats, it forces blood through your arteries. This force is your blood pressure. If your blood pressure is too low for you to go about your normal activities or to support the organs of your body, you have hypotension. Hypotension is also referred to as low blood pressure. When your blood pressure becomes too low, you may not get enough blood to your brain. As a result, you may feel weak, feel lightheaded, or develop a rapid heart rate. In a more severe case, you may faint. CAUSES Various conditions can cause hypotension. These include:  Blood loss.  Dehydration.  Heart or endocrine problems.  Pregnancy.  Severe infection.  Not having a well-balanced diet filled with needed nutrients.  Severe allergic reactions (anaphylaxis). Some medicines, such as blood pressure medicine or water pills (diuretics), may lower your blood pressure below normal. Sometimes taking too much medicine or taking medicine not as directed can cause hypotension. TREATMENT  Hospitalization is sometimes required for hypotension if fluid or blood replacement is needed, if time is needed for medicines to  wear off, or if further monitoring is needed. Treatment might include changing your diet, changing your medicines (including medicines aimed at raising your blood pressure), and use of support stockings. HOME CARE INSTRUCTIONS   Drink enough fluids to keep your urine clear or pale yellow.  Take your medicines as directed by your health care provider.  Get up slowly from reclining or sitting positions. This gives your blood pressure a chance to adjust.  Wear support stockings as directed by your health care provider.  Maintain a healthy diet by including nutritious food, such as fruits, vegetables, nuts, whole grains, and lean meats. SEEK MEDICAL CARE IF:  You have vomiting or diarrhea.  You have a fever for more than 2-3 days.  You feel more thirsty than usual.  You feel weak and tired. SEEK IMMEDIATE MEDICAL CARE IF:   You have chest pain or a fast or irregular heartbeat.  You have a loss of feeling in some part of your body, or you lose movement in your arms or legs.  You have trouble speaking.  You become sweaty or feel lightheaded.  You faint. MAKE SURE YOU:   Understand these instructions.  Will watch your condition.  Will get help right away if you are not doing well or get worse.   This information is not intended to replace advice given to you by your health care provider. Make sure you discuss any questions you have with your health care provider.   Document Released: 08/25/2005 Document Revised: 06/15/2013 Document Reviewed: 02/25/2013 Elsevier Interactive Patient Education 2016 Elsevier Inc. Atrial Fibrillation Atrial fibrillation is a type of irregular  or rapid heartbeat (arrhythmia). In atrial fibrillation, the heart quivers continuously in a chaotic pattern. This occurs when parts of the heart receive disorganized signals that make the heart unable to pump blood normally. This can increase the risk for stroke, heart failure, and other heart-related  conditions. There are different types of atrial fibrillation, including:  Paroxysmal atrial fibrillation. This type starts suddenly, and it usually stops on its own shortly after it starts.  Persistent atrial fibrillation. This type often lasts longer than a week. It may stop on its own or with treatment.  Long-lasting persistent atrial fibrillation. This type lasts longer than 12 months.  Permanent atrial fibrillation. This type does not go away. Talk with your health care provider to learn about the type of atrial fibrillation that you have. CAUSES This condition is caused by some heart-related conditions or procedures, including:  A heart attack.  Coronary artery disease.  Heart failure.  Heart valve conditions.  High blood pressure.  Inflammation of the sac that surrounds the heart (pericarditis).  Heart surgery.  Certain heart rhythm disorders, such as Wolf-Parkinson-White syndrome. Other causes include:  Pneumonia.  Obstructive sleep apnea.  Blockage of an artery in the lungs (pulmonary embolism, or PE).  Lung cancer.  Chronic lung disease.  Thyroid problems, especially if the thyroid is overactive (hyperthyroidism).  Caffeine.  Excessive alcohol use or illegal drug use.  Use of some medicines, including certain decongestants and diet pills. Sometimes, the cause cannot be found. RISK FACTORS This condition is more likely to develop in:  People who are older in age.  People who smoke.  People who have diabetes mellitus.  People who are overweight (obese).  Athletes who exercise vigorously. SYMPTOMS Symptoms of this condition include:  A feeling that your heart is beating rapidly or irregularly.  A feeling of discomfort or pain in your chest.  Shortness of breath.  Sudden light-headedness or weakness.  Getting tired easily during exercise. In some cases, there are no symptoms. DIAGNOSIS Your health care provider may be able to detect  atrial fibrillation when taking your pulse. If detected, this condition may be diagnosed with:  An electrocardiogram (ECG).  A Holter monitor test that records your heartbeat patterns over a 24-hour period.  Transthoracic echocardiogram (TTE) to evaluate how blood flows through your heart.  Transesophageal echocardiogram (TEE) to view more detailed images of your heart.  A stress test.  Imaging tests, such as a CT scan or chest X-ray.  Blood tests. TREATMENT The main goals of treatment are to prevent blood clots from forming and to keep your heart beating at a normal rate and rhythm. The type of treatment that you receive depends on many factors, such as your underlying medical conditions and how you feel when you are experiencing atrial fibrillation. This condition may be treated with:  Medicine to slow down the heart rate, bring the heart's rhythm back to normal, or prevent clots from forming.  Electrical cardioversion. This is a procedure that resets your heart's rhythm by delivering a controlled, low-energy shock to the heart through your skin.  Different types of ablation, such as catheter ablation, catheter ablation with pacemaker, or surgical ablation. These procedures destroy the heart tissues that send abnormal signals. When the pacemaker is used, it is placed under your skin to help your heart beat in a regular rhythm. HOME CARE INSTRUCTIONS  Take over-the counter and prescription medicines only as told by your health care provider.  If your health care provider prescribed a blood-thinning  medicine (anticoagulant), take it exactly as told. Taking too much blood-thinning medicine can cause bleeding. If you do not take enough blood-thinning medicine, you will not have the protection that you need against stroke and other problems.  Do not use tobacco products, including cigarettes, chewing tobacco, and e-cigarettes. If you need help quitting, ask your health care provider.  If  you have obstructive sleep apnea, manage your condition as told by your health care provider.  Do not drink alcohol.  Do not drink beverages that contain caffeine, such as coffee, soda, and tea.  Maintain a healthy weight. Do not use diet pills unless your health care provider approves. Diet pills may make heart problems worse.  Follow diet instructions as told by your health care provider.  Exercise regularly as told by your health care provider.  Keep all follow-up visits as told by your health care provider. This is important. PREVENTION  Avoid drinking beverages that contain caffeine or alcohol.  Avoid certain medicines, especially medicines that are used for breathing problems.  Avoid certain herbs and herbal medicines, such as those that contain ephedra or ginseng.  Do not use illegal drugs, such as cocaine and amphetamines.  Do not smoke.  Manage your high blood pressure. SEEK MEDICAL CARE IF:  You notice a change in the rate, rhythm, or strength of your heartbeat.  You are taking an anticoagulant and you notice increased bruising.  You tire more easily when you exercise or exert yourself. SEEK IMMEDIATE MEDICAL CARE IF:  You have chest pain, abdominal pain, sweating, or weakness.  You feel nauseous.  You notice blood in your vomit, bowel movement, or urine.  You have shortness of breath.  You suddenly have swollen feet and ankles.  You feel dizzy.  You have sudden weakness or numbness of the face, arm, or leg, especially on one side of the body.  You have trouble speaking, trouble understanding, or both (aphasia).  Your face or your eyelid droops on one side. These symptoms may represent a serious problem that is an emergency. Do not wait to see if the symptoms will go away. Get medical help right away. Call your local emergency services (911 in the [place] Do not drive yourself to the hospital.   This information is not intended to replace advice given to  you by your health care provider. Make sure you discuss any questions you have with your health care provider.   Document Released: 08/25/2005 Document Revised: 05/16/2015 Document Reviewed: 12/20/2014 Elsevier Interactive Patient Education [employer].

## 2016-01-30 ENCOUNTER — Encounter: Payer: Managed Care, Other (non HMO) | Admitting: Gastroenterology

## 2016-03-04 ENCOUNTER — Encounter: Payer: Self-pay | Admitting: Gastroenterology

## 2016-03-06 ENCOUNTER — Encounter: Payer: Self-pay | Admitting: Internal Medicine

## 2016-03-07 ENCOUNTER — Other Ambulatory Visit (INDEPENDENT_AMBULATORY_CARE_PROVIDER_SITE_OTHER): Payer: Managed Care, Other (non HMO)

## 2016-03-07 ENCOUNTER — Other Ambulatory Visit: Payer: Self-pay | Admitting: Internal Medicine

## 2016-03-07 DIAGNOSIS — E034 Atrophy of thyroid (acquired): Secondary | ICD-10-CM | POA: Diagnosis not present

## 2016-03-07 DIAGNOSIS — E038 Other specified hypothyroidism: Secondary | ICD-10-CM

## 2016-03-07 LAB — TSH: TSH: 0.69 u[IU]/mL (ref 0.35–4.50)

## 2016-04-21 ENCOUNTER — Telehealth: Payer: Self-pay | Admitting: *Deleted

## 2016-04-21 NOTE — Telephone Encounter (Signed)
OK for pre visit and direct schedule.

## 2016-04-21 NOTE — Telephone Encounter (Signed)
Will proceed as scheduled. Marie PV  

## 2016-04-21 NOTE — Telephone Encounter (Signed)
Dr stark, This patient is scheduled for a PV 8-23 and a colon with you 9-6 Wednesday.  He is a recall for hx colon polyps and has a FH of CC in mother.  He had a recent ED visit for A Fib ( hx of A fib)  01-17-2016 that was triggered by steroids. He saw his cardiologist at [facility] 01-18-2016. ( note in care every where). He instructed hm to use atenolol daily x 1 week then decrease as instructed until off . They discussed ablation but nothing scheduled as of yet. There is phone note in care everywhere that MD told pt he could stop his atenolol as the ot did not want to take a daily medicine. Is this pt ok for a colon now or do you want him to have an OV? Please advise, Thanks Lelan Pons

## 2016-04-30 ENCOUNTER — Ambulatory Visit (AMBULATORY_SURGERY_CENTER): Payer: Self-pay | Admitting: *Deleted

## 2016-04-30 VITALS — Ht 71.0 in | Wt 152.0 lb

## 2016-04-30 DIAGNOSIS — Z800 Family history of malignant neoplasm of digestive organs: Secondary | ICD-10-CM

## 2016-04-30 DIAGNOSIS — Z86010 Personal history of colonic polyps: Secondary | ICD-10-CM

## 2016-04-30 MED ORDER — NA SULFATE-K SULFATE-MG SULF 17.5-3.13-1.6 GM/177ML PO SOLN: 1.0000 | Freq: Once | ORAL | 0 refills | Status: AC

## 2016-04-30 NOTE — Progress Notes (Signed)
No egg or soy allergy known to patient  No issues with past sedation with any surgeries  or procedures, no intubation problems  No diet pills per patient No home 02 use per patient  No blood thinners per patient  Pt denies issues with constipation   hx of A Fib -- last episode 01-2016- see TE 04-21-16

## 2016-05-02 ENCOUNTER — Encounter: Payer: Self-pay | Admitting: Gastroenterology

## 2016-05-14 ENCOUNTER — Ambulatory Visit (AMBULATORY_SURGERY_CENTER): Payer: Managed Care, Other (non HMO) | Admitting: Gastroenterology

## 2016-05-14 ENCOUNTER — Encounter: Payer: Self-pay | Admitting: Gastroenterology

## 2016-05-14 VITALS — BP 108/59 | HR 53 | Temp 98.0°F | Resp 16 | Ht 71.0 in | Wt 152.0 lb

## 2016-05-14 DIAGNOSIS — Z800 Family history of malignant neoplasm of digestive organs: Secondary | ICD-10-CM

## 2016-05-14 DIAGNOSIS — D120 Benign neoplasm of cecum: Secondary | ICD-10-CM | POA: Diagnosis not present

## 2016-05-14 DIAGNOSIS — Z86010 Personal history of colonic polyps: Secondary | ICD-10-CM | POA: Diagnosis not present

## 2016-05-14 HISTORY — PX: COLONOSCOPY WITH PROPOFOL: SHX5780

## 2016-05-14 MED ORDER — SODIUM CHLORIDE 0.9 % IV SOLN: 500.0000 mL | INTRAVENOUS | Status: DC

## 2016-05-14 NOTE — Op Note (Signed)
[facility] Patient Name: Jerik Yildiz Procedure Date: 05/14/2016 9:18 AM MRN: [id number] Endoscopist: Ladene Artist , MD Age: 65 Referring MD:  Date of Birth: [birth year] Gender: Male Account #: [id number] Procedure:                Colonoscopy Indications:              Surveillance: Personal history of adenomatous                            polyps on last colonoscopy > 3 years ago. Family                            history of colon cancer-mother. Medicines:                Monitored Anesthesia Care Procedure:                Pre-Anesthesia Assessment:                           - Prior to the procedure, a History and Physical                            was performed, and patient medications and                            allergies were reviewed. The patient's tolerance of                            previous anesthesia was also reviewed. The risks                            and benefits of the procedure and the sedation                            options and risks were discussed with the patient.                            All questions were answered, and informed consent                            was obtained. Prior Anticoagulants: The patient has                            taken no previous anticoagulant or antiplatelet                            agents. ASA Grade Assessment: II - A patient with                            mild systemic disease. After reviewing the risks                            and benefits, the patient was deemed in  satisfactory condition to undergo the procedure.                           After obtaining informed consent, the colonoscope                            was passed under direct vision. Throughout the                            procedure, the patient's blood pressure, pulse, and                            oxygen saturations were monitored continuously. The                            Model PCF-H190L [id number]) scope  was introduced                            through the anus and advanced to the the cecum,                            identified by appendiceal orifice and ileocecal                            valve. The ileocecal valve, appendiceal orifice,                            and rectum were photographed. The quality of the                            bowel preparation was excellent. The colonoscopy                            was performed without difficulty. The patient                            tolerated the procedure well. Scope In: 9:22:00 AM Scope Out: 9:35:27 AM Scope Withdrawal Time: 0 hours 10 minutes 21 seconds  Total Procedure Duration: 0 hours 13 minutes 27 seconds  Findings:                 A 6 mm polyp was found in the cecum. The polyp was                            sessile. The polyp was removed with a cold snare.                            Resection and retrieval were complete.                           The exam was otherwise without abnormality on                            direct and retroflexion views. Complications:  No immediate complications. Estimated blood loss:                            None. Estimated Blood Loss:     Estimated blood loss: none. Impression:               - One 6 mm polyp in the cecum, removed with a cold                            snare. Resected and retrieved.                           - The examination was otherwise normal on direct                            and retroflexion views. Recommendation:           - Repeat colonoscopy in 5 years for surveillance.                           - Patient has a contact number available for                            emergencies. The signs and symptoms of potential                            delayed complications were discussed with the                            patient. Return to normal activities tomorrow.                            Written discharge instructions were provided to the                             patient.                           - Resume previous diet.                           - Continue present medications.                           - Await pathology results. Ladene Artist, MD 05/14/2016 9:38:22 AM This report has been signed electronically.

## 2016-05-14 NOTE — Progress Notes (Signed)
Patient first stated that he drank 4 oz of coffee at 7:20am.   After he realized that we would have to delay him, he stated that it was "only a few sips."  The CRNA has been notified regarding this development.

## 2016-05-14 NOTE — Patient Instructions (Signed)
Colon polyp x 1 removed today. Handouts given on polyps. Result letter in your mail in 2-3 weeks. Repeat colonoscopy in 5 years. Resume current medications. Call us with any questions or concerns. Thank you!   YOU HAD AN ENDOSCOPIC PROCEDURE TODAY AT [facility]:   Refer to the procedure report that was given to you for any specific questions about what was found during the examination.  If the procedure report does not answer your questions, please call your gastroenterologist to clarify.  If you requested that your care partner not be given the details of your procedure findings, then the procedure report has been included in a sealed envelope for you to review at your convenience later.  YOU SHOULD EXPECT: Some feelings of bloating in the abdomen. Passage of more gas than usual.  Walking can help get rid of the air that was put into your GI tract during the procedure and reduce the bloating. If you had a lower endoscopy (such as a colonoscopy or flexible sigmoidoscopy) you may notice spotting of blood in your stool or on the toilet paper. If you underwent a bowel prep for your procedure, you may not have a normal bowel movement for a few days.  Please Note:  You might notice some irritation and congestion in your nose or some drainage.  This is from the oxygen used during your procedure.  There is no need for concern and it should clear up in a day or so.  SYMPTOMS TO REPORT IMMEDIATELY:   Following lower endoscopy (colonoscopy or flexible sigmoidoscopy):  Excessive amounts of blood in the stool  Significant tenderness or worsening of abdominal pains  Swelling of the abdomen that is new, acute  Fever of 100F or higher   For urgent or emergent issues, a gastroenterologist can be reached at any hour by calling [phone].   DIET:  We do recommend a small meal at first, but then you may proceed to your regular diet.  Drink plenty of fluids but you should avoid alcoholic  beverages for 24 hours.  ACTIVITY:  You should plan to take it easy for the rest of today and you should NOT DRIVE or use heavy machinery until tomorrow (because of the sedation medicines used during the test).    FOLLOW UP: Our staff will call the number listed on your records the next business day following your procedure to check on you and address any questions or concerns that you may have regarding the information given to you following your procedure. If we do not reach you, we will leave a message.  However, if you are feeling well and you are not experiencing any problems, there is no need to return our call.  We will assume that you have returned to your regular daily activities without incident.  If any biopsies were taken you will be contacted by phone or by letter within the next 1-3 weeks.  Please call us at [phone] if you have not heard about the biopsies in 3 weeks.    SIGNATURES/CONFIDENTIALITY: You and/or your care partner have signed paperwork which will be entered into your electronic medical record.  These signatures attest to the fact that that the information above on your After Visit Summary has been reviewed and is understood.  Full responsibility of the confidentiality of this discharge information lies with you and/or your care-partner.

## 2016-05-14 NOTE — Progress Notes (Signed)
Called to room to assist during endoscopic procedure.  Patient ID and intended procedure confirmed with present staff. Received instructions for my participation in the procedure from the performing physician.  

## 2016-05-14 NOTE — Progress Notes (Signed)
Report to PACU, RN, vss, BBS= Clear.  

## 2016-05-15 ENCOUNTER — Telehealth: Payer: Self-pay | Admitting: *Deleted

## 2016-05-15 NOTE — Telephone Encounter (Signed)
  Follow up Call-  Call back number 05/14/2016  Post procedure Call Back phone  # [phone]  Permission to leave phone message Yes  Some recent data might be hidden     Patient questions:  Do you have a fever, pain , or abdominal swelling? No. Pain Score  0 *  Have you tolerated food without any problems? Yes.    Have you been able to return to your normal activities? Yes.    Do you have any questions about your discharge instructions: Diet   No. Medications  No. Follow up visit  No.  Do you have questions or concerns about your Care? No.  Actions: * If pain score is 4 or above: No action needed, pain <4.

## 2016-05-20 ENCOUNTER — Telehealth: Payer: Self-pay | Admitting: Gastroenterology

## 2016-05-20 NOTE — Telephone Encounter (Signed)
Patient notified that we send a letter when the pathology results are available.

## 2016-05-21 ENCOUNTER — Encounter: Payer: Self-pay | Admitting: Gastroenterology

## 2016-05-22 ENCOUNTER — Telehealth: Payer: Self-pay | Admitting: Gastroenterology

## 2016-05-22 NOTE — Telephone Encounter (Signed)
Patient notified of pathology results.  He is notified of pathology results were also mailed.  All questions answered.

## 2017-05-04 ENCOUNTER — Encounter: Payer: Self-pay | Admitting: Internal Medicine

## 2017-05-04 ENCOUNTER — Ambulatory Visit (INDEPENDENT_AMBULATORY_CARE_PROVIDER_SITE_OTHER): Payer: 59 | Admitting: Internal Medicine

## 2017-05-04 VITALS — BP 132/78 | HR 75 | Temp 98.0°F | Ht 71.0 in | Wt 150.2 lb

## 2017-05-04 DIAGNOSIS — K219 Gastro-esophageal reflux disease without esophagitis: Secondary | ICD-10-CM

## 2017-05-04 DIAGNOSIS — I480 Paroxysmal atrial fibrillation: Secondary | ICD-10-CM | POA: Diagnosis not present

## 2017-05-04 DIAGNOSIS — R1013 Epigastric pain: Secondary | ICD-10-CM | POA: Diagnosis not present

## 2017-05-04 NOTE — Patient Instructions (Signed)
Call or return to clinic prn if these symptoms worsen or fail to improve as anticipated.  Return for your annual exam as scheduled  Avoids foods high in acid such as tomatoes citrus juices, and spicy foods.  Avoid eating within two hours of lying down or before exercising.  Do not overheat.  Try smaller more frequent meals.  If symptoms persist, please return for follow-up visit

## 2017-05-04 NOTE — Progress Notes (Signed)
Subjective:    Patient ID: Eric Kaufman, male    DOB: 17-Nov-1950, 66 y.o.   MRN: [id number]  HPI  66 year old patient who presents for evaluation of epigastric discomfort.  This initially started 3 days ago while eating at a restaurant where he consumed beer and very spicy food.  He described a 30 minute episode of epigastric fullness with some radiation to the back area.  Symptoms were immediately relieved by belching. Over the past 2 days he has had 2 much less severe in intensity and duration, and again all episodes were relieved quickly by belching He has not taken any antacids or acid suppressant type medications. He was concerned about symptoms of an abdominal aortic aneurysm  Past Medical History:  Diagnosis Date  . Anxiety   . Palpitations   . Papilledema   . PSVT   . Sleep apnea    mild uses cpap     Social History   Social History  . Marital status: Married    Spouse name: N/A  . Number of children: N/A  . Years of education: N/A   Occupational History  . Artist Self Employed   Social History Main Topics  . Smoking status: Never Smoker  . Smokeless tobacco: Never Used  . Alcohol use Yes     Comment: rarely  . Drug use: No  . Sexual activity: Not on file   Other Topics Concern  . Not on file   Social History Narrative  . No narrative on file    Past Surgical History:  Procedure Laterality Date  . COLONOSCOPY    . heart ablation N/A 02/16/2015  . NASAL SINUS SURGERY  04/03/2015  . POLYPECTOMY      Family History  Problem Relation Age of Onset  . Lung cancer Father   . Cancer Father        lung  . Cancer Mother        colon  . Hypertension Mother   . Colon cancer Mother   . Colon polyps Sister   . Colon cancer Maternal Uncle   . Esophageal cancer Neg Hx   . Rectal cancer Neg Hx   . Stomach cancer Neg Hx     Allergies  Allergen Reactions  . Aspirin Swelling  . Penicillins     Per pt: unknown    Current Outpatient Prescriptions on  File Prior to Visit  Medication Sig Dispense Refill  . atenolol (TENORMIN) 50 MG tablet Take 50 mg by mouth as needed (palpatations).     Current Facility-Administered Medications on File Prior to Visit  Medication Dose Route Frequency Provider Last Rate Last Dose  . 0.9 %  sodium chloride infusion  500 mL Intravenous Continuous Ladene Artist, MD        BP 132/78 (BP Location: Left Arm, Patient Position: Sitting, Cuff Size: Normal)   Pulse 75   Temp 98 F (36.7 C) (Oral)   Ht 5\' 11"  (1.803 m)   Wt 150 lb 3.2 oz (68.1 kg)   SpO2 97%   BMI 20.95 kg/m     Review of Systems  Constitutional: Negative for appetite change, chills, fatigue and fever.  HENT: Negative for congestion, dental problem, ear pain, hearing loss, sore throat, tinnitus, trouble swallowing and voice change.   Eyes: Negative for pain, discharge and visual disturbance.  Respiratory: Negative for cough, chest tightness, wheezing and stridor.   Cardiovascular: Negative for chest pain, palpitations and leg swelling.  Gastrointestinal: Positive for abdominal  pain. Negative for abdominal distention, blood in stool, constipation, diarrhea, nausea and vomiting.  Genitourinary: Negative for difficulty urinating, discharge, flank pain, genital sores, hematuria and urgency.  Musculoskeletal: Negative for arthralgias, back pain, gait problem, joint swelling, myalgias and neck stiffness.  Skin: Negative for rash.  Neurological: Negative for dizziness, syncope, speech difficulty, weakness, numbness and headaches.  Hematological: Negative for adenopathy. Does not bruise/bleed easily.  Psychiatric/Behavioral: Negative for behavioral problems and dysphoric mood. The patient is not nervous/anxious.        Objective:   Physical Exam  Constitutional: He appears well-developed and well-nourished. No distress.  Blood pressure 124/74  HENT:  Mouth/Throat: Oropharynx is clear and moist.  Cardiovascular: Normal rate, regular rhythm  and normal heart sounds.   Pulmonary/Chest: Effort normal and breath sounds normal.  Abdominal: Soft. Bowel sounds are normal. He exhibits no distension and no mass. There is no tenderness. There is no rebound.          Assessment & Plan:   Symptoms seem most compatible with GI etiology.  Symptoms now have larger resolved.  We'll clinically observe at this point.  He will report any new or worsening symptoms. We'll consider screening for AAA.  At the time of his next physical, although seems to be low risk  Nyoka Cowden

## 2017-05-28 ENCOUNTER — Encounter: Payer: Self-pay | Admitting: Internal Medicine

## 2017-09-03 IMAGING — DX DG CHEST 2V
2 series · 2 of 2 positions shown · non-contrast
Comparison: 01/01/2016

CLINICAL DATA: Chest pain and palpitations

EXAM:
CHEST  2 VIEW

[chest pa]
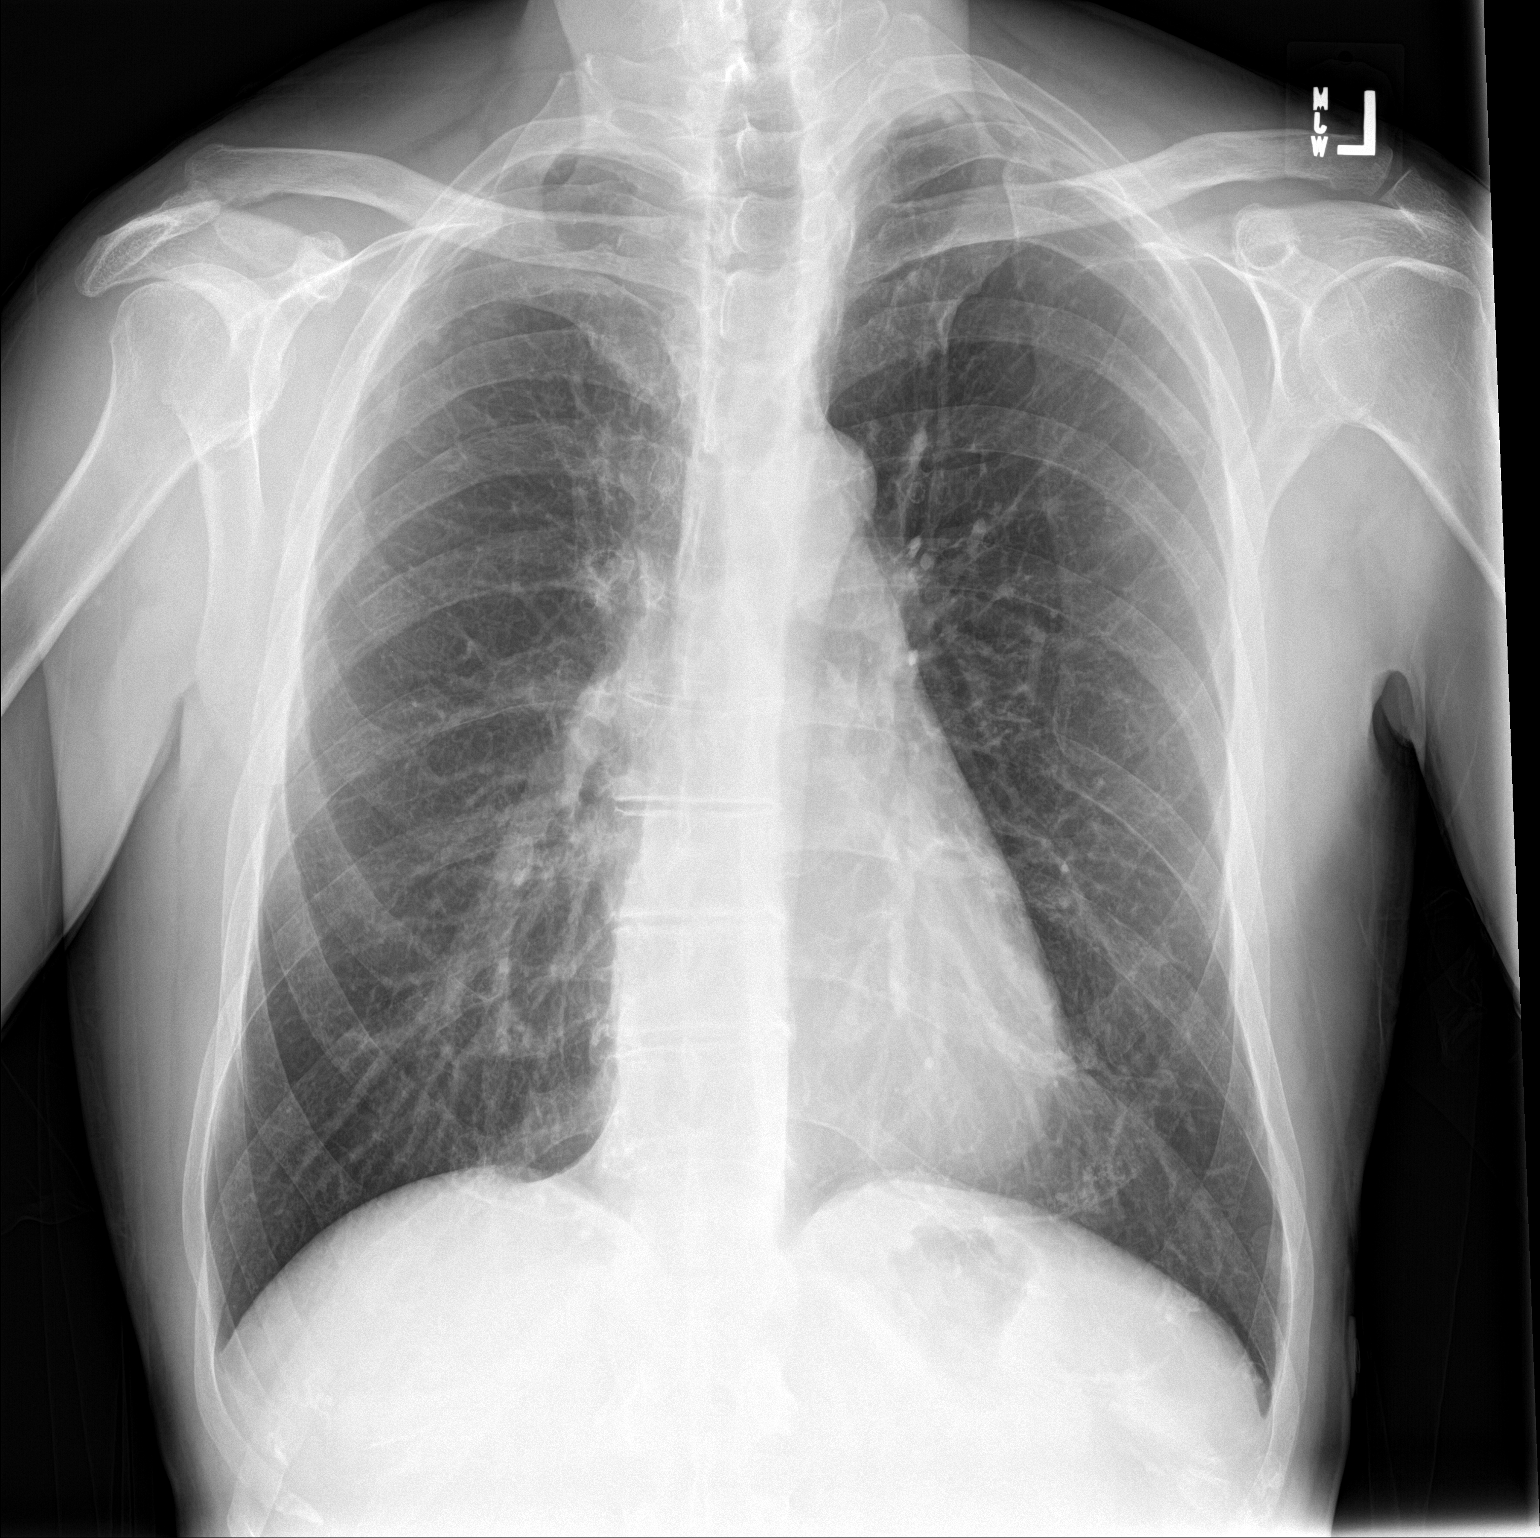

[chest lat]
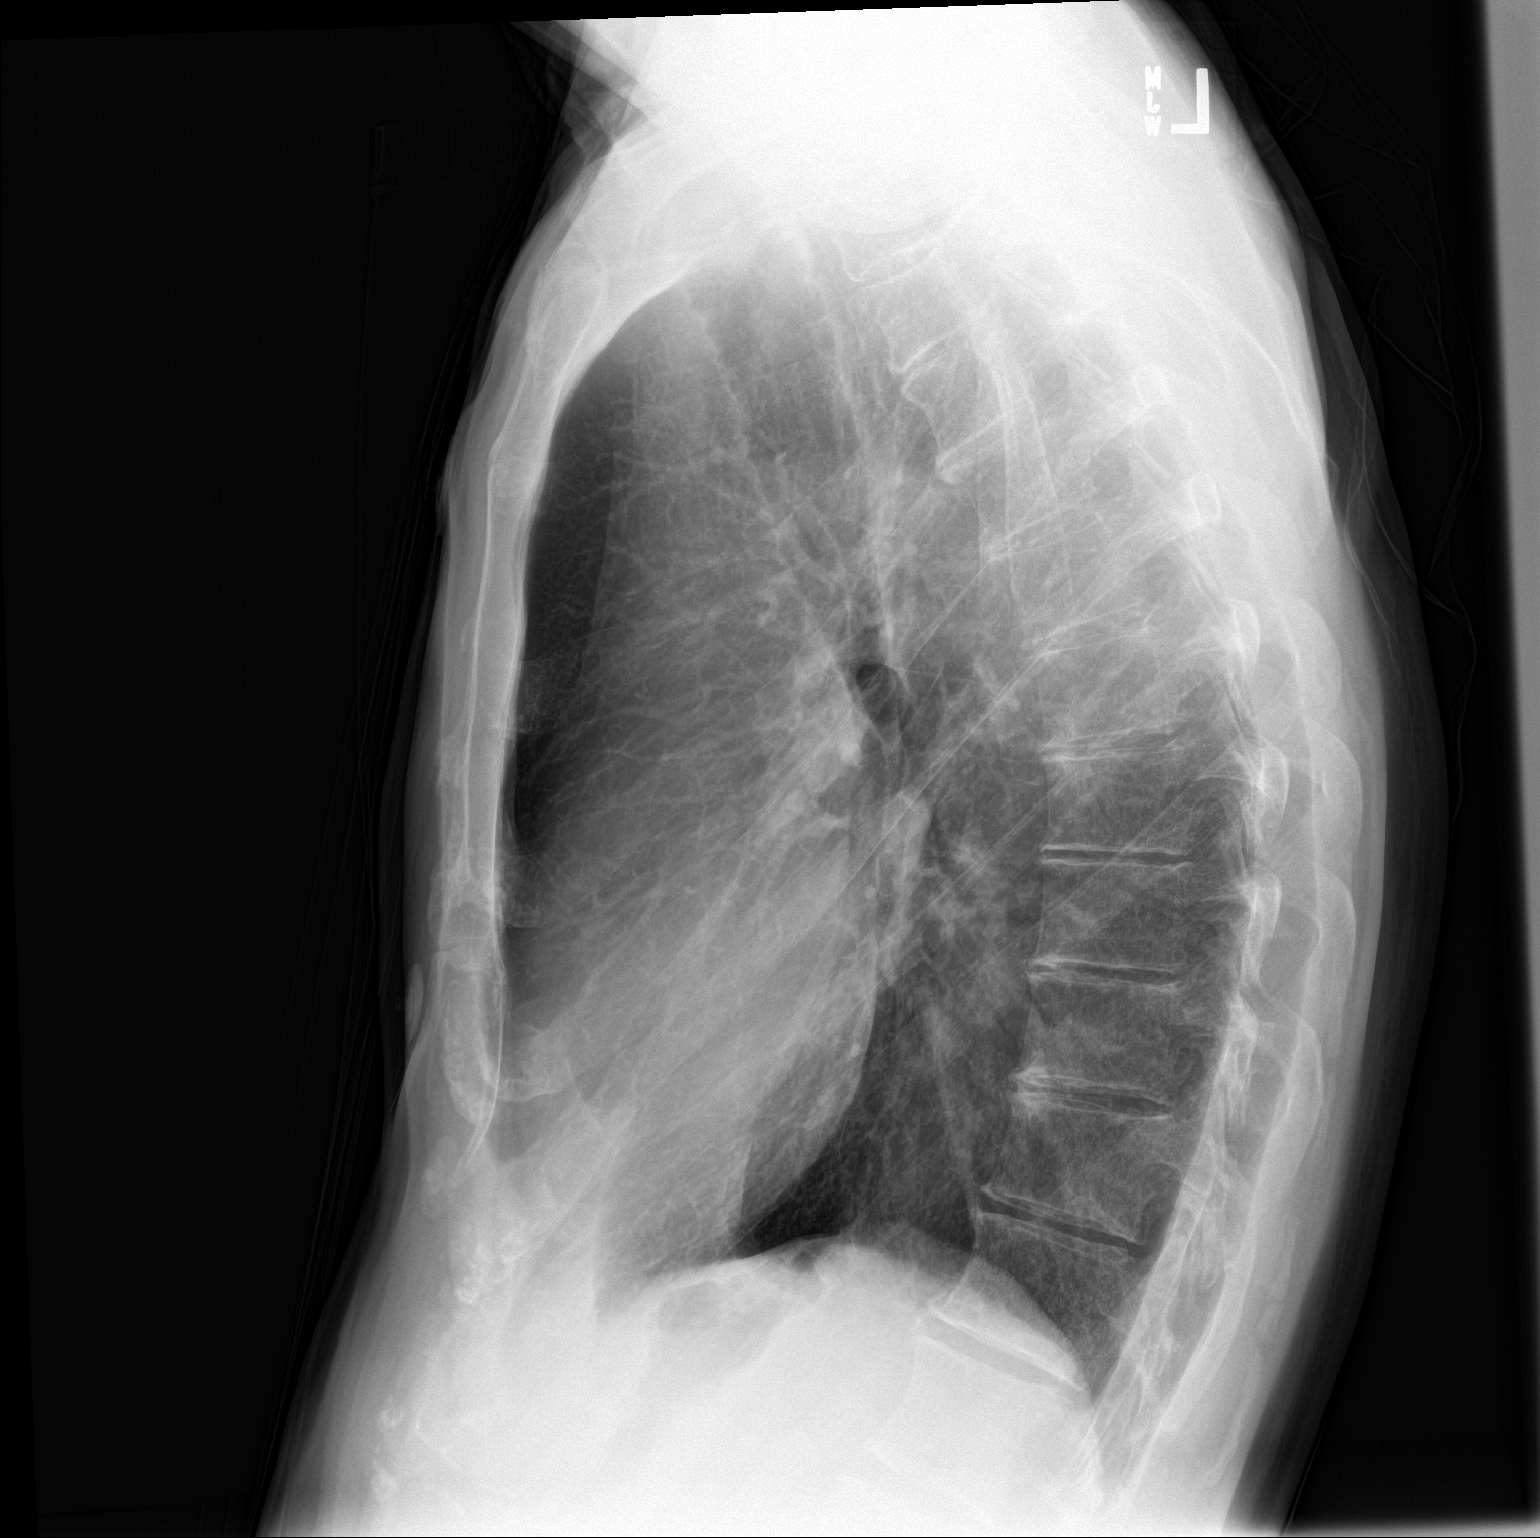

[2 of 2 positions shown; findings below may reference images not displayed]

FINDINGS: Cardiac shadow is stable. The lungs are hyperinflated consistent
with COPD. Mild apical scarring is again noted. No acute bony
abnormality is seen.
IMPRESSION: COPD without acute abnormality.

## 2017-11-04 ENCOUNTER — Encounter: Payer: Self-pay | Admitting: Internal Medicine

## 2017-11-04 ENCOUNTER — Ambulatory Visit (INDEPENDENT_AMBULATORY_CARE_PROVIDER_SITE_OTHER): Payer: 59 | Admitting: Internal Medicine

## 2017-11-04 VITALS — BP 110/70 | HR 66 | Temp 98.3°F | Wt 155.0 lb

## 2017-11-04 DIAGNOSIS — L237 Allergic contact dermatitis due to plants, except food: Secondary | ICD-10-CM

## 2017-11-04 MED ORDER — TRIAMCINOLONE ACETONIDE 0.1 % EX CREA: 1.0000 "application " | TOPICAL_CREAM | Freq: Two times a day (BID) | CUTANEOUS | 2 refills | Status: DC

## 2017-11-04 NOTE — Patient Instructions (Addendum)
Poison Ivy Dermatitis Poison ivy dermatitis is inflammation of the skin that is caused by the allergens on the leaves of the poison ivy plant. The skin reaction often involves redness, swelling, blisters, and extreme itching. What are the causes? This condition is caused by a specific chemical (urushiol) found in the sap of the poison ivy plant. This chemical is sticky and can be easily spread to people, animals, and objects. You can get poison ivy dermatitis by:  Having direct contact with a poison ivy plant.  Touching animals, other people, or objects that have come in contact with poison ivy and have the chemical on them.  What increases the risk? This condition is more likely to develop in:  People who are outdoors often.  People who go outdoors without wearing protective clothing, such as closed shoes, long pants, and a long-sleeved shirt.  What are the signs or symptoms? Symptoms of this condition include:  Redness and itching.  A rash that often includes bumps and blisters. The rash usually appears 48 hours after exposure.  Swelling. This may occur if the reaction is more severe.  Symptoms usually last for 1-2 weeks. However, the first time you develop this condition, symptoms may last 3-4 weeks. How is this diagnosed? This condition may be diagnosed based on your symptoms and a physical exam. Your health care provider may also ask you about any recent outdoor activity. How is this treated? Treatment for this condition will vary depending on how severe it is. Treatment may include:  Hydrocortisone creams or calamine lotions to relieve itching.  Oatmeal baths to soothe the skin.  Over-the-counter antihistamine tablets.  Oral steroid medicine for more severe outbreaks.  Follow these instructions at home:  Take or apply over-the-counter and prescription medicines only as told by your health care provider.  Wash exposed skin as soon as possible with soap and cold  water.  Use hydrocortisone creams or calamine lotion as needed to soothe the skin and relieve itching.  Take oatmeal baths as needed. Use colloidal oatmeal. You can get this at your local pharmacy or grocery store. Follow the instructions on the packaging.  Do not scratch or rub your skin.  While you have the rash, wash clothes right after you wear them. How is this prevented?  Learn to identify the poison ivy plant and avoid contact with the plant. This plant can be recognized by the number of leaves. Generally, poison ivy has three leaves with flowering branches on a single stem. The leaves are typically glossy, and they have jagged edges that come to a point at the front.  If you have been exposed to poison ivy, thoroughly wash with soap and water right away. You have about 30 minutes to remove the plant resin before it will cause the rash. Be sure to wash under your fingernails because any plant resin there will continue to spread the rash.  When hiking or camping, wear clothes that will help you to avoid exposure on the skin. This includes long pants, a long-sleeved shirt, tall socks, and hiking boots. You can also apply preventive lotion to your skin to help limit exposure.  If you suspect that your clothes or outdoor gear came in contact with poison ivy, rinse them off outside with a garden hose before you bring them inside your house. Contact a health care provider if:  You have open sores in the rash area.  You have more redness, swelling, or pain in the affected area.  You have   redness that spreads beyond the rash area.  You have fluid, blood, or pus coming from the affected area.  You have a fever.  You have a rash over a large area of your body.  You have a rash on your eyes, mouth, or genitals.  Your rash does not improve after a few days. Get help right away if:  Your face swells or your eyes swell shut.  You have trouble breathing.  You have trouble  swallowing. This information is not intended to replace advice given to you by your health care provider. Make sure you discuss any questions you have with your health care provider. Document Released: 08/22/2000 Document Revised: 01/31/2016 Document Reviewed: 01/31/2015 Elsevier Interactive Patient Education  2018 [employer] Inc[employer]  

## 2017-11-04 NOTE — Progress Notes (Signed)
Subjective:    Patient ID: Eric Kaufman, male    DOB: Aug 28, 1951, 67 y.o.   MRN: [id number]  HPI  67 year old patient who presents with a 2-week history of a rash involving primarily inner aspects of both arms left greater than the right.  Is also had some pruritic dermatitis involving his abdomen and trunk area.  Rash she has started soon after doing some yard work.  He has had similar rashes in the past due to contact dermatitis.  He has been using calamine lotion with benefit.  Only minimal pruritus at this time  Past Medical History:  Diagnosis Date  . Anxiety   . Palpitations   . Papilledema   . PSVT   . Sleep apnea    mild uses cpap     Social History   Socioeconomic History  . Marital status: Married    Spouse name: Not on file  . Number of children: Not on file  . Years of education: Not on file  . Highest education level: Not on file  Social Needs  . Financial resource strain: Not on file  . Food insecurity - worry: Not on file  . Food insecurity - inability: Not on file  . Transportation needs - medical: Not on file  . Transportation needs - non-medical: Not on file  Occupational History  . 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 Smoking status: Never Smoker  . Smokeless tobacco: Never Used  Substance and Sexual Activity  . Alcohol use: Yes    Comment: rarely  . Drug use: No  . Sexual activity: Not on file  Other Topics Concern  . Not on file  Social History Narrative  . Not on file    Past Surgical History:  Procedure Laterality Date  . COLONOSCOPY    . heart ablation N/A 02/16/2015  . NASAL SINUS SURGERY  04/03/2015  . POLYPECTOMY      Family History  Problem Relation Age of Onset  . Lung cancer Father   . Cancer Father        lung  . Cancer Mother        colon  . Hypertension Mother   . Colon cancer Mother   . Colon polyps Sister   . Colon cancer Maternal Uncle   . Esophageal cancer Neg Hx   . Rectal cancer Neg  Hx   . Stomach cancer Neg Hx     Allergies  Allergen Reactions  . Aspirin Swelling  . Penicillins     Per pt: unknown    Current Outpatient Medications on File Prior to Visit  Medication Sig Dispense Refill  . atenolol (TENORMIN) 50 MG tablet Take 50 mg by mouth as needed (palpatations).     Current Facility-Administered Medications on File Prior to Visit  Medication Dose Route Frequency Provider Last Rate Last Dose  . 0.9 %  sodium chloride infusion  500 mL Intravenous Continuous Ladene Artist, MD        BP 110/70 (BP Location: Left Arm, Patient Position: Sitting, Cuff Size: Normal)   Pulse 66   Temp 98.3 F (36.8 C) (Oral)   Wt 155 lb (70.3 kg)   SpO2 99%   BMI 21.62 kg/m     Review of Systems  Constitutional: Negative for appetite change, chills, fatigue and fever.  HENT: Negative for congestion, dental problem, ear pain, hearing loss, sore throat, tinnitus, trouble swallowing and voice change.   Eyes: Negative for  pain, discharge and visual disturbance.  Respiratory: Negative for cough, chest tightness, wheezing and stridor.   Cardiovascular: Negative for chest pain, palpitations and leg swelling.  Gastrointestinal: Negative for abdominal distention, abdominal pain, blood in stool, constipation, diarrhea, nausea and vomiting.  Genitourinary: Negative for difficulty urinating, discharge, flank pain, genital sores, hematuria and urgency.  Musculoskeletal: Negative for arthralgias, back pain, gait problem, joint swelling, myalgias and neck stiffness.  Skin: Positive for rash.  Neurological: Negative for dizziness, syncope, speech difficulty, weakness, numbness and headaches.  Hematological: Negative for adenopathy. Does not bruise/bleed easily.  Psychiatric/Behavioral: Negative for behavioral problems and dysphoric mood. The patient is not nervous/anxious.        Objective:   Physical Exam  Constitutional: He appears well-developed and well-nourished. No distress.   Skin: Rash noted.  Scattered maculopapular rash involving the inner aspects of both arms especially left scattered lesions also noted on the trunk and abdomen          Assessment & Plan:   Contact dermatitis.  Symptoms are improving.  Will avoid systemic medications and use topical triamcinolone.  Nyoka Cowden

## 2018-09-11 DIAGNOSIS — S335XXA Sprain of ligaments of lumbar spine, initial encounter: Secondary | ICD-10-CM | POA: Diagnosis not present

## 2018-10-29 DIAGNOSIS — G4733 Obstructive sleep apnea (adult) (pediatric): Secondary | ICD-10-CM | POA: Diagnosis not present

## 2018-11-11 DIAGNOSIS — Z9989 Dependence on other enabling machines and devices: Secondary | ICD-10-CM | POA: Diagnosis not present

## 2018-11-11 DIAGNOSIS — J339 Nasal polyp, unspecified: Secondary | ICD-10-CM | POA: Diagnosis not present

## 2018-11-11 DIAGNOSIS — I480 Paroxysmal atrial fibrillation: Secondary | ICD-10-CM | POA: Diagnosis not present

## 2018-11-11 DIAGNOSIS — G4733 Obstructive sleep apnea (adult) (pediatric): Secondary | ICD-10-CM | POA: Diagnosis not present

## 2019-01-27 DIAGNOSIS — G4733 Obstructive sleep apnea (adult) (pediatric): Secondary | ICD-10-CM | POA: Diagnosis not present

## 2019-04-05 DIAGNOSIS — G4733 Obstructive sleep apnea (adult) (pediatric): Secondary | ICD-10-CM | POA: Diagnosis not present

## 2019-04-19 DIAGNOSIS — H539 Unspecified visual disturbance: Secondary | ICD-10-CM | POA: Diagnosis not present

## 2019-06-02 DIAGNOSIS — B86 Scabies: Secondary | ICD-10-CM | POA: Diagnosis not present

## 2019-06-16 DIAGNOSIS — H43813 Vitreous degeneration, bilateral: Secondary | ICD-10-CM | POA: Diagnosis not present

## 2019-06-16 DIAGNOSIS — H539 Unspecified visual disturbance: Secondary | ICD-10-CM | POA: Diagnosis not present

## 2019-06-16 DIAGNOSIS — H43391 Other vitreous opacities, right eye: Secondary | ICD-10-CM | POA: Diagnosis not present

## 2019-07-09 DIAGNOSIS — G4733 Obstructive sleep apnea (adult) (pediatric): Secondary | ICD-10-CM | POA: Diagnosis not present

## 2019-08-24 DIAGNOSIS — Z8639 Personal history of other endocrine, nutritional and metabolic disease: Secondary | ICD-10-CM | POA: Diagnosis not present

## 2019-08-24 DIAGNOSIS — I480 Paroxysmal atrial fibrillation: Secondary | ICD-10-CM | POA: Diagnosis not present

## 2019-08-24 DIAGNOSIS — G4733 Obstructive sleep apnea (adult) (pediatric): Secondary | ICD-10-CM | POA: Diagnosis not present

## 2019-08-24 DIAGNOSIS — I471 Supraventricular tachycardia: Secondary | ICD-10-CM | POA: Diagnosis not present

## 2019-10-10 DIAGNOSIS — G4733 Obstructive sleep apnea (adult) (pediatric): Secondary | ICD-10-CM | POA: Diagnosis not present

## 2019-11-15 DIAGNOSIS — G4733 Obstructive sleep apnea (adult) (pediatric): Secondary | ICD-10-CM | POA: Diagnosis not present

## 2019-11-15 DIAGNOSIS — Z9989 Dependence on other enabling machines and devices: Secondary | ICD-10-CM | POA: Diagnosis not present

## 2019-11-15 DIAGNOSIS — I480 Paroxysmal atrial fibrillation: Secondary | ICD-10-CM | POA: Diagnosis not present

## 2019-11-29 ENCOUNTER — Other Ambulatory Visit: Payer: Self-pay

## 2019-11-29 ENCOUNTER — Telehealth: Payer: Self-pay | Admitting: Family Medicine

## 2019-11-29 NOTE — Telephone Encounter (Signed)
Please advise 

## 2019-11-29 NOTE — Telephone Encounter (Signed)
Pt has been notified and notified needs to make toc appt

## 2019-11-29 NOTE — Telephone Encounter (Signed)
Pt states that he would like to have [name] as his PCP. He said he accepted his wife as a pt back in September of 2020 and him too but I do not see any notes or appts on his chart that states this.

## 2019-11-29 NOTE — Telephone Encounter (Signed)
I am OK to see.

## 2019-11-30 ENCOUNTER — Ambulatory Visit (INDEPENDENT_AMBULATORY_CARE_PROVIDER_SITE_OTHER): Payer: Medicare HMO | Admitting: Family Medicine

## 2019-11-30 ENCOUNTER — Ambulatory Visit (INDEPENDENT_AMBULATORY_CARE_PROVIDER_SITE_OTHER): Payer: Medicare HMO

## 2019-11-30 ENCOUNTER — Encounter: Payer: Self-pay | Admitting: Family Medicine

## 2019-11-30 VITALS — BP 120/62 | HR 71 | Temp 97.8°F | Wt 160.3 lb

## 2019-11-30 DIAGNOSIS — Z8639 Personal history of other endocrine, nutritional and metabolic disease: Secondary | ICD-10-CM | POA: Diagnosis not present

## 2019-11-30 DIAGNOSIS — R5383 Other fatigue: Secondary | ICD-10-CM

## 2019-11-30 DIAGNOSIS — M542 Cervicalgia: Secondary | ICD-10-CM | POA: Diagnosis not present

## 2019-11-30 LAB — BASIC METABOLIC PANEL
BUN: 15 mg/dL (ref 6–23)
CO2: 32 mEq/L (ref 19–32)
Calcium: 9.1 mg/dL (ref 8.4–10.5)
Chloride: 101 mEq/L (ref 96–112)
Creatinine, Ser: 0.76 mg/dL (ref 0.40–1.50)
GFR: 102.1 mL/min (ref >60.00)
Glucose, Bld: 70 mg/dL (ref 70–99)
Potassium: 4 mEq/L (ref 3.5–5.1)
Sodium: 139 mEq/L (ref 135–145)

## 2019-11-30 LAB — TSH: TSH: 1.17 u[IU]/mL (ref 0.35–4.50)

## 2019-11-30 NOTE — Progress Notes (Signed)
Subjective:     Patient ID: Eric Kaufman, male   DOB: 25-Dec-1950, 69 y.o.   MRN: [id number]  HPI Mr. Victorino recently called to establish care with me.  He had previously seen Dr. Burnice Logan.  Past history of paroxysmal atrial fibrillation and PSVT.  He states he had "several years ago.  History of mildly elevated lipids.  He brings up the following issues for discussion  He has noticed some decreased range of motion of the neck and also some tightness and occasional soreness cervical spine with movement.  He states he had injury about 40 years ago when cycling.  He had a severe sprain had some intermittent symptoms since then.  He has noted decreased range of motion especially when rotating to the left side.  No recent injury.  No radiculitis symptoms.  No upper extremity numbness or weakness.  Patient requesting TSH.  He states he had history of thyroiditis years ago and was told to get his thyroid checked periodically.  Does not have any excessive fatigue or other symptoms of overt hypothyroidism  He is requesting BMP.  He has had some mild fatigue recently.  No chest pains.  No dyspnea.  Past Medical History:  Diagnosis Date  . Anxiety   . Palpitations   . Papilledema   . PSVT   . Sleep apnea    mild uses cpap   Past Surgical History:  Procedure Laterality Date  . COLONOSCOPY    . heart ablation N/A 02/16/2015  . NASAL SINUS SURGERY  04/03/2015  . POLYPECTOMY      reports that he has never smoked. He has never used smokeless tobacco. He reports current alcohol use. He reports that he does not use drugs. family history includes Cancer in his father and mother; Colon cancer in his maternal uncle and mother; Colon polyps in his sister; Hypertension in his mother; Lung cancer in his father. Allergies  Allergen Reactions  . Aspirin Swelling  . Penicillins     Per pt: unknown     Review of Systems  Constitutional: Positive for fatigue. Negative for appetite change, chills,  fever and unexpected weight change.  HENT: Negative for sore throat.   Eyes: Negative for visual disturbance.  Respiratory: Negative for cough, chest tightness and shortness of breath.   Cardiovascular: Negative for chest pain, palpitations and leg swelling.  Musculoskeletal: Positive for neck pain and neck stiffness.  Neurological: Negative for dizziness, syncope, weakness, light-headedness and headaches.  Hematological: Negative for adenopathy.       Objective:   Physical Exam Vitals reviewed.  Constitutional:      Appearance: Normal appearance.  Neck:     Comments: He has limited range of motion with lateral bending to the right and left as well as rotation to the right and left.  He also has fairly limited in his ability to extend the neck.  There is no spinal tenderness.  No neck adenopathy. Cardiovascular:     Rate and Rhythm: Normal rate and regular rhythm.  Pulmonary:     Effort: Pulmonary effort is normal.     Breath sounds: Normal breath sounds.  Musculoskeletal:     Right lower leg: No edema.     Left lower leg: No edema.  Lymphadenopathy:     Cervical: No cervical adenopathy.  Neurological:     Mental Status: He is alert.     Comments: Full strength upper extremities with symmetric reflexes.        Assessment:     #  1 progressive limited range of motion cervical spine with some progressive pains over recent months.  Suspect he has diffuse degenerative spondylosis.  He reports remote history of injury.  #2 reported history of "thyroiditis ".  Patient requesting thyroid check.  No history of hypothyroidism.    Plan:     -We will check cervical spine series -Check TSH and basic metabolic panel -Consider trial of physical therapy if neck symptoms progress but will get x-rays first -Consider setting up complete physical this year  Eulas Post MD [facility]

## 2019-12-05 ENCOUNTER — Other Ambulatory Visit: Payer: Self-pay

## 2019-12-05 DIAGNOSIS — M542 Cervicalgia: Secondary | ICD-10-CM

## 2020-01-13 DIAGNOSIS — G4733 Obstructive sleep apnea (adult) (pediatric): Secondary | ICD-10-CM | POA: Diagnosis not present

## 2020-01-26 ENCOUNTER — Ambulatory Visit: Payer: Medicare HMO | Attending: Family Medicine | Admitting: Physical Therapy

## 2020-01-26 ENCOUNTER — Encounter: Payer: Self-pay | Admitting: Physical Therapy

## 2020-01-26 ENCOUNTER — Other Ambulatory Visit: Payer: Self-pay

## 2020-01-26 DIAGNOSIS — M62838 Other muscle spasm: Secondary | ICD-10-CM

## 2020-01-26 DIAGNOSIS — R293 Abnormal posture: Secondary | ICD-10-CM | POA: Diagnosis not present

## 2020-01-26 DIAGNOSIS — M542 Cervicalgia: Secondary | ICD-10-CM | POA: Diagnosis not present

## 2020-01-26 NOTE — Therapy (Signed)
[facility] [address], [place], [place], [address] Phone: [phone]   Fax:  [phone]  Physical Therapy Evaluation  Patient Details  Name: Eric Kaufman MRN: [id number] Date of Birth: [birth year] Referring Provider (PT): Carolann Littler, MD    Encounter Date: 01/26/2020  PT End of Session - 01/26/20 1028    Visit Number  1    Date for PT Re-Evaluation  04/06/20    Authorization Type  [employer] Medicare    Authorization Time Period  01/26/20 to 04/06/20    Authorization - Visit Number  1    PT Start Time  0931    PT Stop Time  1020    PT Time Calculation (min)  49 min    Activity Tolerance  No increased pain;Patient tolerated treatment well    Behavior During Therapy  [facility] for tasks assessed/performed       Past Medical History:  Diagnosis Date  . Anxiety   . Palpitations   . Papilledema   . PSVT   . Sleep apnea    mild uses cpap    Past Surgical History:  Procedure Laterality Date  . COLONOSCOPY    . heart ablation N/A 02/16/2015  . NASAL SINUS SURGERY  04/03/2015  . POLYPECTOMY      There were no vitals filed for this visit.   Subjective Assessment - 01/26/20 0934    Subjective  Pt states that his neck has been stiff and bothering him more in the past year. He states that he had an injury in his 21s and 1 in his 29s. He has soreness and tightness in the past year that is worse when working on the computer/painting.    Limitations  Sitting    Diagnostic tests  Xray: DDD C5, C6, C7    Patient Stated Goals  improve stiffness and pain    Currently in Pain?  No/denies    Pain Descriptors / Indicators  Aching;Sore    Pain Type  Chronic pain    Pain Radiating Towards  none    Pain Onset  More than a month ago    Pain Frequency  Intermittent    Aggravating Factors   turning head Lt or Rt, looking up         [facility] PT Assessment - 01/26/20 0001      Assessment   Medical Diagnosis  neck pain     Referring Provider (PT)  Carolann Littler, MD     Onset Date/Surgical Date  --   ~1 year ago     Precautions   Precautions  None      Restrictions   Weight Bearing Restrictions  No      Balance Screen   Has the patient fallen in the past 6 months  No    Has the patient had a decrease in activity level because of a fear of falling?   No    Is the patient reluctant to leave their home because of a fear of falling?   No      Home Film/video editor residence      Prior Function   Leisure  computer work and painting several hours at a time       Cognition   Overall Cognitive Status  Within Functional Limits for tasks assessed      Posture/Postural Control   Posture Comments  pt slouched sitting with forward head, rounded shoulders  ROM / Strength   AROM / PROM / Strength  AROM;PROM;Strength      AROM   AROM Assessment Site  Cervical    Cervical Extension  30 deg    pain end range    Cervical - Right Side Bend  15 deg     Cervical - Left Side Bend  10 deg     Cervical - Right Rotation  30 deg     Cervical - Left Rotation  20 deg       PROM   Overall PROM Comments  upper cervical rotation limited Lt greater than Rt: less than 30 deg       Strength   Overall Strength Comments  BUE strength 5/5 MMT      Palpation   Spinal mobility  hypomobile cervical spine C4-T6    Palpation comment  trigger points noted sub occipitals                  Objective measurements completed on examination: See above findings.      [place] Adult PT Treatment/Exercise - 01/26/20 0001      Self-Care   Self-Care  Posture    Posture  awareness of posture throughout the day      Exercises   Exercises  Neck      Neck Exercises: Seated   Other Seated Exercise  thoracic extension over foam roll seated x5 reps       Neck Exercises: Supine   Neck Retraction  5 reps;3 secs    Neck Retraction Limitations  cues to complete head nod prior to retraction, on 2  pillows     Capital Flexion  5 reps;3 secs    Capital Flexion Limitations  head nod with 2 pillows      Neck Exercises: Stretches   Chest Stretch Limitations  pec stretch in doorway HEP demo     Other Neck Stretches  cervical rotation with towel assist x3 reps each direction HEP demo              PT Education - 01/26/20 1028    Education Details  eval findings/POC; goals for PT; implemented and reviewed HEP    Person(s) Educated  Patient    Methods  Explanation;Tactile cues;Handout    Comprehension  Verbalized understanding;Returned demonstration       PT Short Term Goals - 01/26/20 1035      PT SHORT TERM GOAL #1   Title  Pt will be independent with his initial HEP to increase cervical ROM and strength.    Time  3    Period  Weeks    Status  New    Target Date  02/16/20        PT Long Term Goals - 01/26/20 1035      PT LONG TERM GOAL #1   Title  Pt will have atleast 50 deg of active cervical rotation to improve his ability to turn his head while driving    Time  6    Period  Weeks    Status  New    Target Date  04/06/20      PT LONG TERM GOAL #2   Title  Pt will report atleast 60% improvement in his neck pain and stiffness from the start of PT.    Time  6    Period  Weeks    Status  New      PT LONG TERM GOAL #3   Title  Pt will  be able to maintian upright posture throughout his session, without the need for cues from PT.    Time  6    Period  Weeks    Status  New      PT LONG TERM GOAL #4   Title  Pt will be able to verbalize desk/posture adjustments while seated at his computer, to decrease stiffness throughout the day.    Time  6    Period  Weeks    Status  New             Plan - 01/26/20 1030    Clinical Impression Statement  Pt is a pleasant 69 y.o M referred to OPPT with complaints of neck pain and stiffness in the past year. He demonstrates posture of excessive thoracic kyphosis and forward head. Pt has significant limitations in  cervical rotation each direction, with no more than 30 deg at eval. Pt's cervical spine mobility is limited throughout, with some restrictions in the sub occipital region as well. Pt would benefit from skilled PT to provide education on good body mechanics and increase his cervical strength and ROM for driving and completing other activity with less limitation.    Personal Factors and Comorbidities  Age;Time since onset of injury/illness/exacerbation    Examination-Activity Limitations  Sit    Examination-Participation Restrictions  Other    Stability/Clinical Decision Making  Stable/Uncomplicated    Clinical Decision Making  Low    Rehab Potential  Good    PT Frequency  2x / week    PT Duration  6 weeks    PT Treatment/Interventions  ADLs/Self Care Home Management;Moist Heat;Electrical Stimulation;Therapeutic activities;Therapeutic exercise;Neuromuscular re-education;Manual techniques;Dry needling;Passive range of motion;Taping;Patient/family education    PT Next Visit Plan  f/u on HEP adherence; manual  to address rotation deficits; possible d/n multifidi and sub occipitals if pt agreeable (wasn't discussed at eval); posture strengthening    PT Home Exercise Plan  Access Code: [email or web address]    Recommended Other Services  none    Consulted and Agree with Plan of Care  Patient       Patient will benefit from skilled therapeutic intervention in order to improve the following deficits and impairments:  Pain, Postural dysfunction, Increased muscle spasms, Decreased mobility, Hypomobility, Decreased strength, Decreased range of motion, Impaired flexibility  Visit Diagnosis: Cervicalgia  Other muscle spasm  Abnormal posture     Problem List Patient Active Problem List   Diagnosis Date Noted  . PAF (paroxysmal atrial fibrillation) ([place]) 04/20/2014  . H/O elevated lipids 08/11/2013  . Dizziness 06/09/2012  . Cerumen impaction 06/09/2012  . Nasal polyposis 05/18/2012  . Thyroiditis 07/04/2011   . PSVT 08/21/2010  . PALPITATIONS 05/04/2009    10:39 AM,01/26/20 Sherol Dade PT, DPT [facility]  [facility] [address], [place], [place], [address] Phone: [phone]   Fax:  [phone]  Name: Eric Kaufman MRN: [id number] Date of Birth: July 14, 1951

## 2020-01-26 NOTE — Patient Instructions (Signed)
Access Code: [email or web address]: [email or web address]: 05/20/2021Prepared by: [place] Head Nod - 2 x daily - 7 x weekly - 1 sets - 10 reps - 5 seconds hold  Supine Chin Tuck - 2 x daily - 7 x weekly - 1 sets - 10 reps  Doorway Pec Stretch at 90 Degrees Abduction - 2 x daily - 7 x weekly - 3 sets - 10 reps - 30 hold  Seated Assisted Cervical Rotation with Towel - 2 x daily - 7 x weekly - 2 sets - 10 reps - 1-2 seconds hold   [facility] [address], [place], [place] [address] Phone # [phone] Fax [phone]

## 2020-02-03 ENCOUNTER — Other Ambulatory Visit: Payer: Self-pay

## 2020-02-03 ENCOUNTER — Ambulatory Visit: Payer: Medicare HMO | Admitting: Physical Therapy

## 2020-02-03 DIAGNOSIS — R293 Abnormal posture: Secondary | ICD-10-CM

## 2020-02-03 DIAGNOSIS — M62838 Other muscle spasm: Secondary | ICD-10-CM | POA: Diagnosis not present

## 2020-02-03 DIAGNOSIS — M542 Cervicalgia: Secondary | ICD-10-CM | POA: Diagnosis not present

## 2020-02-03 NOTE — Therapy (Signed)
[facility] [address], [place], [place], [address] Phone: [phone]   Fax:  [phone]  Physical Therapy Treatment  Patient Details  Name: Eric Kaufman MRN: [id number] Date of Birth: 03-31-1951 Referring Provider (PT): Carolann Littler, MD    Encounter Date: 02/03/2020  PT End of Session - 02/03/20 1201    Visit Number  2    Date for PT Re-Evaluation  04/06/20    Authorization Type  [employer] Medicare    Authorization Time Period  01/26/20 to 04/06/20    PT Start Time  1100    PT Stop Time  1147    PT Time Calculation (min)  47 min    Activity Tolerance  No increased pain;Patient tolerated treatment well       Past Medical History:  Diagnosis Date  . Anxiety   . Palpitations   . Papilledema   . PSVT   . Sleep apnea    mild uses cpap    Past Surgical History:  Procedure Laterality Date  . COLONOSCOPY    . heart ablation N/A 02/16/2015  . NASAL SINUS SURGERY  04/03/2015  . POLYPECTOMY      There were no vitals filed for this visit.  Subjective Assessment - 02/03/20 1102    Subjective  The chin tucks were painful so I had to stop.  Just hurts to turn head.  Lying down hurts my low back.    Diagnostic tests  Xray: DDD C5, C6, C7    Currently in Pain?  No/denies                        [facility] Adult PT Treatment/Exercise - 02/03/20 0001      Self-Care   Posture  tennis ball suboccipital stretch; discussed the importance of holding 30 sec with stretches      Neck Exercises: Seated   Neck Retraction  5 reps    Postural Training  seated flexion emphasizing upper c spine flexion 5x     Other Seated Exercise  self suboccipital stretch     Other Seated Exercise  review of HEP to discuss modifications for pain in neck or low back       Moist Heat Therapy   Number Minutes Moist Heat  3 Minutes    Moist Heat Location  Cervical      Manual Therapy   Joint Mobilization  C2-3 rotation mobs  with movements 5x right/left     Soft tissue mobilization  bil cervical paraspinals     Myofascial Release  suboccipital release 2 min     Manual Traction  2x 30 sec       Trigger Point Dry Needling - 02/03/20 0001    Consent Given?  Yes    Education Handout Provided  Yes    Muscles Treated Head and Neck  Suboccipitals;Cervical multifidi    Other Dry Needling  bil     Suboccipitals Response  Palpable increased muscle length    Cervical multifidi Response  Palpable increased muscle length           PT Education - 02/03/20 1159    Education Details  [email or web address]   suboccipital release, tennis ball stretch,  seated chin tucks with pillow behind head against wall    Person(s) Educated  Patient    Methods  Explanation;Demonstration;Handout    Comprehension  Returned demonstration;Verbalized understanding       PT Short Term Goals - 01/26/20  [place] #1   Title  Pt will be independent with his initial HEP to increase cervical ROM and strength.    Time  3    Period  Weeks    Status  New    Target Date  02/16/20        PT Long Term Goals - 01/26/20 1035      PT LONG TERM GOAL #1   Title  Pt will have atleast 50 deg of active cervical rotation to improve his ability to turn his head while driving    Time  6    Period  Weeks    Status  New    Target Date  04/06/20      PT LONG TERM GOAL #2   Title  Pt will report atleast 60% improvement in his neck pain and stiffness from the start of PT.    Time  6    Period  Weeks    Status  New      PT LONG TERM GOAL #3   Title  Pt will be able to maintian upright posture throughout his session, without the need for cues from PT.    Time  6    Period  Weeks    Status  New      PT LONG TERM GOAL #4   Title  Pt will be able to verbalize desk/posture adjustments while seated at his computer, to decrease stiffness throughout the day.    Time  6    Period  Weeks    Status  New            Plan - 02/03/20  1139    Clinical Impression Statement  Due to the longevity of the issue and chronically shortened tissue, positioning modified with wedge and additional pillows to accomodate for cervical protrusion.   Modifications were made to his HEP for alternate ways to stretch upper cervical spine musculature.  He is receptive to dry needling and manual therapy with improved soft tissue length and ROM noted following treatment session.   Cervical rotation significantly improved.  Therapist monitoring response with all interventions.    Personal Factors and Comorbidities  Age;Time since onset of injury/illness/exacerbation    Rehab Potential  Good    PT Frequency  2x / week    PT Duration  6 weeks    PT Treatment/Interventions  ADLs/Self Care Home Management;Moist Heat;Electrical Stimulation;Therapeutic activities;Therapeutic exercise;Neuromuscular re-education;Manual techniques;Dry needling;Passive range of motion;Taping;Patient/family education    PT Next Visit Plan  assess response to DN #1;  manual therapy for increased rotation;  seated or standing ex for comfort; start  band rows, extensions and horizontal abduction    PT Home Exercise Plan  Access Code: [email or web address]       Patient will benefit from skilled therapeutic intervention in order to improve the following deficits and impairments:  Pain, Postural dysfunction, Increased muscle spasms, Decreased mobility, Hypomobility, Decreased strength, Decreased range of motion, Impaired flexibility  Visit Diagnosis: Cervicalgia  Other muscle spasm  Abnormal posture     Problem List Patient Active Problem List   Diagnosis Date Noted  . PAF (paroxysmal atrial fibrillation) ([place]) 04/20/2014  . H/O elevated lipids 08/11/2013  . Dizziness 06/09/2012  . Cerumen impaction 06/09/2012  . Nasal polyposis 05/18/2012  . Thyroiditis 07/04/2011  . PSVT 08/21/2010  . PALPITATIONS 05/04/2009   Eric Kaufman, PT 02/03/20 12:11 PM Phone: [phone] Fax:  [phone] Eric Kaufman 02/03/2020,  12:11 PM  [facility] [address] [place], [place], [address] Phone: [phone]   Fax:  [phone]  Name: Eric Kaufman MRN: [id number] Date of Birth: 10-Jul-1951

## 2020-02-03 NOTE — Patient Instructions (Signed)
  Trigger Point Dry Needling  . What is Trigger Point Dry Needling (DN)? o DN is a physical therapy technique used to treat muscle pain and dysfunction. Specifically, DN helps deactivate muscle trigger points (muscle knots).  o A thin filiform needle is used to penetrate the skin and stimulate the underlying trigger point. The goal is for a local twitch response (LTR) to occur and for the trigger point to relax. No medication of any kind is injected during the procedure.   . What Does Trigger Point Dry Needling Feel Like?  o The procedure feels different for each individual patient. Some patients report that they do not actually feel the needle enter the skin and overall the process is not painful. Very mild bleeding may occur. However, many patients feel a deep cramping in the muscle in which the needle was inserted. This is the local twitch response.   Marland Kitchen How Will I feel after the treatment? o Soreness is normal, and the onset of soreness may not occur for a few hours. Typically this soreness does not last longer than two days.  o Bruising is uncommon, however; ice can be used to decrease any possible bruising.  o In rare cases feeling tired or nauseous after the treatment is normal. In addition, your symptoms may get worse before they get better, this period will typically not last longer than 24 hours.   . What Can I do After My Treatment? o Increase your hydration by drinking more water for the next 24 hours. o You may place ice or heat on the areas treated that have become sore, however, do not use heat on inflamed or bruised areas. Heat often brings more relief post needling. o You can continue your regular activities, but vigorous activity is not recommended initially after the treatment for 24 hours. o DN is best combined with other physical therapy such as strengthening, stretching, and other therapies.     Access Code: [email or web address] URL: [email or web address] Date:  02/03/2020 Prepared by: Ruben Im  Exercises Beginner Head Nod - 2 x daily - 7 x weekly - 1 sets - 10 reps - 5 seconds hold Supine Chin Tuck - 2 x daily - 7 x weekly - 1 sets - 10 reps Doorway Pec Stretch at 90 Degrees Abduction - 2 x daily - 7 x weekly - 3 sets - 10 reps - 30 hold Seated Assisted Cervical Rotation with Towel - 2 x daily - 7 x weekly - 2 sets - 10 reps - 1-2 seconds hold Sub-Occipital Cervical Stretch - 1 x daily - 7 x weekly - 1 sets - 3 reps - 30 hold Supine Suboccipital Release with Tennis Balls - 1 x daily - 7 x weekly - 1 sets - 120 hold Seated Cervical Retraction - 1 x daily - 7 x weekly - 1 sets - 10 reps

## 2020-02-10 ENCOUNTER — Other Ambulatory Visit: Payer: Self-pay

## 2020-02-10 ENCOUNTER — Ambulatory Visit: Payer: Medicare HMO | Attending: Family Medicine | Admitting: Physical Therapy

## 2020-02-10 DIAGNOSIS — M542 Cervicalgia: Secondary | ICD-10-CM | POA: Diagnosis not present

## 2020-02-10 DIAGNOSIS — R293 Abnormal posture: Secondary | ICD-10-CM | POA: Diagnosis not present

## 2020-02-10 DIAGNOSIS — M62838 Other muscle spasm: Secondary | ICD-10-CM | POA: Insufficient documentation

## 2020-02-10 NOTE — Therapy (Signed)
[facility] [address] [place], [place], [address] Phone: [phone]   Fax:  [phone]  Physical Therapy Treatment  Patient Details  Name: Eric Kaufman MRN: [id number] Date of Birth: February 25, 1951 Referring Provider (PT): Carolann Littler, MD    Encounter Date: 02/10/2020  PT End of Session - 02/10/20 1104    Visit Number  3    Date for PT Re-Evaluation  04/06/20    Authorization Type  [employer] Medicare    Authorization Time Period  01/26/20 to 04/06/20    PT Start Time  1016    PT Stop Time  1057    PT Time Calculation (min)  41 min    Activity Tolerance  No increased pain;Patient tolerated treatment well       Past Medical History:  Diagnosis Date  . Anxiety   . Palpitations   . Papilledema   . PSVT   . Sleep apnea    mild uses cpap    Past Surgical History:  Procedure Laterality Date  . COLONOSCOPY    . heart ablation N/A 02/16/2015  . NASAL SINUS SURGERY  04/03/2015  . POLYPECTOMY      There were no vitals filed for this visit.  Subjective Assessment - 02/10/20 1018    Subjective  The dry needling helped.  Pain and soreness is less.    Diagnostic tests  Xray: DDD C5, C6, C7    Currently in Pain?  No/denies         [facility] PT Assessment - 02/10/20 0001      AROM   Cervical Extension  42    Cervical - Right Side Bend  22    Cervical - Left Side Bend  24    Cervical - Right Rotation  20    Cervical - Left Rotation  20                    [facility] Adult PT Treatment/Exercise - 02/10/20 0001      Neck Exercises: Standing   Other Standing Exercises  2 folded towels behind head on wall with UE raises 5x    Other Standing Exercises  folded towels behind head with snow angels       Neck Exercises: Prone   Other Prone Exercise  head lift from towel roll 5x       Moist Heat Therapy   Number Minutes Moist Heat  3 Minutes    Moist Heat Location  Cervical      Manual Therapy   Manual  therapy comments  contract relax upper traps seated 3x 5 sec hold     Joint Mobilization  C2-3 rotation mobs with movements 5x right/left     Soft tissue mobilization  bil cervical paraspinals        Trigger Point Dry Needling - 02/10/20 0001    Consent Given?  Yes    Muscles Treated Head and Neck  Upper trapezius    Other Dry Needling  bil     Upper Trapezius Response  Twitch reponse elicited;Palpable increased muscle length    Suboccipitals Response  Palpable increased muscle length    Cervical multifidi Response  Palpable increased muscle length           PT Education - 02/10/20 1057    Education Details  [id number]  wall angels, prone head lift    Person(s) Educated  Patient    Methods  Explanation;Demonstration;Handout    Comprehension  Returned  demonstration;Verbalized understanding       PT Short Term Goals - 01/26/20 1035      PT SHORT TERM GOAL #1   Title  Pt will be independent with his initial HEP to increase cervical ROM and strength.    Time  3    Period  Weeks    Status  New    Target Date  02/16/20        PT Long Term Goals - 01/26/20 1035      PT LONG TERM GOAL #1   Title  Pt will have atleast 50 deg of active cervical rotation to improve his ability to turn his head while driving    Time  6    Period  Weeks    Status  New    Target Date  04/06/20      PT LONG TERM GOAL #2   Title  Pt will report atleast 60% improvement in his neck pain and stiffness from the start of PT.    Time  6    Period  Weeks    Status  New      PT LONG TERM GOAL #3   Title  Pt will be able to maintian upright posture throughout his session, without the need for cues from PT.    Time  6    Period  Weeks    Status  New      PT LONG TERM GOAL #4   Title  Pt will be able to verbalize desk/posture adjustments while seated at his computer, to decrease stiffness throughout the day.    Time  6    Period  Weeks    Status  New            Plan - 02/10/20 1058     Clinical Impression Statement  The patient has improved cervical extension and bilateral sidebending ROM.  Cervical rotation remains very limited.  The patient continues to have severely protruded head position in standing and sitting.  Initiated cervical extensor strengthening with accommodation of towels.  Patient admits he is unlikely to do all the ex's but will do some of them.  Therapist monitoring response with all interventions.  Improved soft tissue mobility and ROM post treatment session.    Rehab Potential  Good    PT Frequency  2x / week    PT Duration  6 weeks    PT Treatment/Interventions  ADLs/Self Care Home Management;Moist Heat;Electrical Stimulation;Therapeutic activities;Therapeutic exercise;Neuromuscular re-education;Manual techniques;Dry needling;Passive range of motion;Taping;Patient/family education    PT Next Visit Plan  assess response to DN #2;  manual therapy for increased rotation;  ball on wall for increased rotation;   start  band rows, extensions and horizontal abduction;  limit how many ex's on HEP for better compliance    PT Home Exercise Plan  Access Code: [email or web address]       Patient will benefit from skilled therapeutic intervention in order to improve the following deficits and impairments:  Pain, Postural dysfunction, Increased muscle spasms, Decreased mobility, Hypomobility, Decreased strength, Decreased range of motion, Impaired flexibility  Visit Diagnosis: Cervicalgia  Other muscle spasm  Abnormal posture     Problem List Patient Active Problem List   Diagnosis Date Noted  . PAF (paroxysmal atrial fibrillation) ([place]) 04/20/2014  . H/O elevated lipids 08/11/2013  . Dizziness 06/09/2012  . Cerumen impaction 06/09/2012  . Nasal polyposis 05/18/2012  . Thyroiditis 07/04/2011  . PSVT 08/21/2010  . PALPITATIONS 05/04/2009   Ruben Im, PT  02/10/20 11:16 AM Phone: [phone] Fax: [phone] Alvera Singh 02/10/2020, 11:15 AM  [facility]  [facility] [address], [place], [place], [address] Phone: [phone]   Fax:  [phone]  Name: Eric Kaufman MRN: [id number] Date of Birth: [birth year]

## 2020-02-10 NOTE — Patient Instructions (Signed)
Access Code: [email or web address] URL: [email or web address] Date: 02/10/2020 Prepared by: Ruben Im  Exercises Beginner Head Nod - 2 x daily - 7 x weekly - 1 sets - 10 reps - 5 seconds hold Supine Chin Tuck - 2 x daily - 7 x weekly - 1 sets - 10 reps Doorway Pec Stretch at 90 Degrees Abduction - 2 x daily - 7 x weekly - 3 sets - 10 reps - 30 hold Seated Assisted Cervical Rotation with Towel - 2 x daily - 7 x weekly - 2 sets - 10 reps - 1-2 seconds hold Sub-Occipital Cervical Stretch - 1 x daily - 7 x weekly - 1 sets - 3 reps - 30 hold Supine Suboccipital Release with Tennis Balls - 1 x daily - 7 x weekly - 1 sets - 120 hold Seated Cervical Retraction - 1 x daily - 7 x weekly - 1 sets - 10 reps Wall Angels - 1 x daily - 7 x weekly - 1 sets - 10 reps Prone Cervical Retraction - 1 x daily - 7 x weekly - 1 sets - 10 reps

## 2020-02-13 DIAGNOSIS — G4733 Obstructive sleep apnea (adult) (pediatric): Secondary | ICD-10-CM | POA: Diagnosis not present

## 2020-02-14 ENCOUNTER — Other Ambulatory Visit: Payer: Self-pay

## 2020-02-14 ENCOUNTER — Ambulatory Visit: Payer: Medicare HMO | Admitting: Physical Therapy

## 2020-02-14 DIAGNOSIS — M62838 Other muscle spasm: Secondary | ICD-10-CM

## 2020-02-14 DIAGNOSIS — R293 Abnormal posture: Secondary | ICD-10-CM

## 2020-02-14 DIAGNOSIS — M542 Cervicalgia: Secondary | ICD-10-CM

## 2020-02-14 NOTE — Patient Instructions (Signed)
Access Code: [email or web address] URL: [email or web address] Date: 02/14/2020 Prepared by: Ruben Im  Exercises Seated Assisted Cervical Rotation with Towel - 2 x daily - 7 x weekly - 2 sets - 10 reps - 1-2 seconds hold Sub-Occipital Cervical Stretch - 1 x daily - 7 x weekly - 1 sets - 3 reps - 30 hold Supine Suboccipital Release with Tennis Balls - 1 x daily - 7 x weekly - 1 sets - 120 hold Seated Cervical Retraction - 1 x daily - 7 x weekly - 1 sets - 10 reps Wall Angels - 1 x daily - 7 x weekly - 1 sets - 10 reps Prone Cervical Retraction - 1 x daily - 7 x weekly - 1 sets - 10 reps Standing Shoulder Horizontal Abduction with Resistance - 1 x daily - 7 x weekly - 2 sets - 10 reps Standing Low Shoulder Row with Anchored Resistance - 1 x daily - 7 x weekly - 2 sets - 10 reps Shoulder Extension with Resistance - 1 x daily - 7 x weekly - 2 sets - 10 reps

## 2020-02-14 NOTE — Therapy (Signed)
[facility] [address], [place], [place], [address] Phone: [phone]   Fax:  [phone]  Physical Therapy Treatment  Patient Details  Name: Eric Kaufman MRN: [id number] Date of Birth: 29-Dec-1950 Referring Provider (PT): Carolann Littler, MD    Encounter Date: 02/14/2020  PT End of Session - 02/14/20 1014    Visit Number  4    Date for PT Re-Evaluation  04/06/20    Authorization Type  [employer] Medicare    Authorization Time Period  01/26/20 to 04/06/20    PT Start Time  0929    PT Stop Time  1011    PT Time Calculation (min)  42 min    Activity Tolerance  No increased pain;Patient tolerated treatment well       Past Medical History:  Diagnosis Date   Anxiety    Palpitations    Papilledema    PSVT    Sleep apnea    mild uses cpap    Past Surgical History:  Procedure Laterality Date   COLONOSCOPY     heart ablation N/A 02/16/2015   NASAL SINUS SURGERY  04/03/2015   POLYPECTOMY      There were no vitals filed for this visit.  Subjective Assessment - 02/14/20 0929    Subjective  I had one bad day but I was looking up at the gutters for 30 minutes.  AM stiffness.    Diagnostic tests  Xray: DDD C5, C6, C7    Currently in Pain?  Yes    Pain Score  1     Pain Location  Neck                        [facility] Adult PT Treatment/Exercise - 02/14/20 0001      Neck Exercises: Theraband   Shoulder Extension  10 reps;Green    Rows  10 reps;Green    Horizontal ABduction  10 reps;Green      Neck Exercises: Standing   Other Standing Exercises  small blue ball cervical rotation facing wall 10x     Other Standing Exercises  nods on blue ball facing wall 10x       Neck Exercises: Seated   Other Seated Exercise  review of cervical rotation with towel hooking arm over back of chair 10x corrected technique      Manual Therapy   Joint Mobilization  C2-3 rotation mobs with movements 5x  right/left; thoracic extension mobs grade 2/3 2x 10 seated in chair     Soft tissue mobilization  bil cervical paraspinals     Manual Traction  2x 30 sec seated              PT Education - 02/14/20 1013    Education Details  green band rows, extensions, horizontal abduction    Person(s) Educated  Patient    Methods  Explanation;Demonstration;Handout    Comprehension  Returned demonstration;Verbalized understanding       PT Short Term Goals - 02/14/20 1619      PT SHORT TERM GOAL #1   Title  Pt will be independent with his initial HEP to increase cervical ROM and strength.    Status  Achieved        PT Long Term Goals - 01/26/20 1035      PT LONG TERM GOAL #1   Title  Pt will have atleast 50 deg of active cervical rotation to improve his ability to turn his  head while driving    Time  6    Period  Weeks    Status  New    Target Date  04/06/20      PT LONG TERM GOAL #2   Title  Pt will report atleast 60% improvement in his neck pain and stiffness from the start of PT.    Time  6    Period  Weeks    Status  New      PT LONG TERM GOAL #3   Title  Pt will be able to maintian upright posture throughout his session, without the need for cues from PT.    Time  6    Period  Weeks    Status  New      PT LONG TERM GOAL #4   Title  Pt will be able to verbalize desk/posture adjustments while seated at his computer, to decrease stiffness throughout the day.    Time  6    Period  Weeks    Status  New            Plan - 02/14/20 1000    Clinical Impression Statement  Initated postural strengthening with resisted band for cervical/upper quarter stabilization.  Corrected technique with cervical rotation with towel to limit compensatory thoracic rotation.  Moderate cues for seated postural correction secondary to excessive cervical protrusion, thoracic kyphosis affecting alignment with rotation ROM.  Limited additions to HEP per pt request and with stream lining of  previous ex's.    Personal Factors and Comorbidities  Age;Time since onset of injury/illness/exacerbation    Rehab Potential  Good    PT Frequency  2x / week    PT Duration  6 weeks    PT Treatment/Interventions  ADLs/Self Care Home Management;Moist Heat;Electrical Stimulation;Therapeutic activities;Therapeutic exercise;Neuromuscular re-education;Manual techniques;Dry needling;Passive range of motion;Taping;Patient/family education    PT Next Visit Plan  DN as needed   manual therapy for increased rotation;  green  band rows, extensions and horizontal abduction;  limit how many ex's on HEP for better compliance    PT Home Exercise Plan  Access Code: [email or web address]       Patient will benefit from skilled therapeutic intervention in order to improve the following deficits and impairments:  Pain, Postural dysfunction, Increased muscle spasms, Decreased mobility, Hypomobility, Decreased strength, Decreased range of motion, Impaired flexibility  Visit Diagnosis: Cervicalgia  Other muscle spasm  Abnormal posture     Problem List Patient Active Problem List   Diagnosis Date Noted   PAF (paroxysmal atrial fibrillation) ([place]) 04/20/2014   H/O elevated lipids 08/11/2013   Dizziness 06/09/2012   Cerumen impaction 06/09/2012   Nasal polyposis 05/18/2012   Thyroiditis 07/04/2011   PSVT 08/21/2010   PALPITATIONS 05/04/2009   Ruben Im, PT 02/14/20 4:21 PM Phone: [phone] Fax: [phone] Alvera Singh 02/14/2020, 4:20 PM  [facility] [address] [place], [place], [address] Phone: [phone]   Fax:  [phone]  Name: ISSAAC SHIPPER MRN: [id number] Date of Birth: June 15, 1951

## 2020-02-19 DIAGNOSIS — S335XXA Sprain of ligaments of lumbar spine, initial encounter: Secondary | ICD-10-CM | POA: Diagnosis not present

## 2020-02-21 ENCOUNTER — Ambulatory Visit: Payer: Medicare HMO | Admitting: Family Medicine

## 2020-02-21 ENCOUNTER — Ambulatory Visit: Payer: Medicare HMO | Admitting: Physical Therapy

## 2020-02-28 ENCOUNTER — Encounter: Payer: Medicare HMO | Admitting: Physical Therapy

## 2020-03-07 ENCOUNTER — Encounter: Payer: Medicare HMO | Admitting: Physical Therapy

## 2020-03-09 ENCOUNTER — Ambulatory Visit: Payer: Medicare HMO | Attending: Family Medicine | Admitting: Physical Therapy

## 2020-03-09 ENCOUNTER — Other Ambulatory Visit: Payer: Self-pay

## 2020-03-09 DIAGNOSIS — R293 Abnormal posture: Secondary | ICD-10-CM | POA: Insufficient documentation

## 2020-03-09 DIAGNOSIS — M542 Cervicalgia: Secondary | ICD-10-CM | POA: Insufficient documentation

## 2020-03-09 DIAGNOSIS — M62838 Other muscle spasm: Secondary | ICD-10-CM | POA: Diagnosis not present

## 2020-03-09 NOTE — Therapy (Signed)
[facility] [address] [place], [place], [address] Phone: [phone]   Fax:  [phone]  Physical Therapy Treatment/Discharge Summary   Patient Details  Name: Eric Kaufman MRN: [id number] Date of Birth: [birth year] Referring Provider (PT): Carolann Littler, MD    Encounter Date: 03/09/2020   PT End of Session - 03/09/20 0918    Visit Number 5    Date for PT Re-Evaluation 04/06/20    Authorization Type [employer]ET[employer] Medicare    Authorization Time Period 01/26/20 to 04/06/20    PT Start Time 0845    PT Stop Time 0102   DN, heat; discharge visit   PT Time Calculation (min) 33 min    Activity Tolerance Patient tolerated treatment well           Past Medical History:  Diagnosis Date  . Anxiety   . Palpitations   . Papilledema   . PSVT   . Sleep apnea    mild uses cpap    Past Surgical History:  Procedure Laterality Date  . COLONOSCOPY    . heart ablation N/A 02/16/2015  . NASAL SINUS SURGERY  04/03/2015  . POLYPECTOMY      There were no vitals filed for this visit.   Subjective Assessment - 03/09/20 0847    Subjective I think the neck exercises (sitting up straight or standing up straight) bothers my low back and I threw my back out.   I was also on a ladder.  My neck seems to be OK, it's the low back that bothers me more.  I think today should be my last day.  I think the DN helps.  I'll do the rotation exercises.    Diagnostic tests Xray: DDD C5, C6, C7    Currently in Pain? No/denies    Pain Score 0-No pain   turning 2/10   Pain Location Neck              [facility] PT Assessment - 03/09/20 0001      Observation/Other Assessments   Focus on Therapeutic Outcomes (FOTO)  27%      AROM   Cervical Flexion 45    Cervical Extension 38    Cervical - Right Side Bend 18    Cervical - Left Side Bend 25    Cervical - Right Rotation 20    Cervical - Left Rotation 12                          [facility] Adult PT Treatment/Exercise - 03/09/20 0001      Neck Exercises: Seated   Other Seated Exercise review of basic HEP     Other Seated Exercise review of ROM objective measurements and FOTO results       Moist Heat Therapy   Number Minutes Moist Heat 3 Minutes    Moist Heat Location Cervical      Manual Therapy   Soft tissue mobilization bil cervical paraspinals, bil upper traps, suboccipitals             Trigger Point Dry Needling - 03/09/20 0001    Consent Given? Yes    Other Dry Needling bil     Upper Trapezius Response Twitch reponse elicited;Palpable increased muscle length    Suboccipitals Response Palpable increased muscle length    Cervical multifidi Response Palpable increased muscle length  PT Short Term Goals - 03/09/20 0931      PT SHORT TERM GOAL #1   Title Pt will be independent with his initial HEP to increase cervical ROM and strength.    Status Achieved             PT Long Term Goals - 03/09/20 0931      PT LONG TERM GOAL #1   Title Pt will have atleast 50 deg of active cervical rotation to improve his ability to turn his head while driving    Status Not Met      PT LONG TERM GOAL #2   Title Pt will report atleast 60% improvement in his neck pain and stiffness from the start of PT.    Status Partially Met      PT LONG TERM GOAL #3   Title Pt will be able to maintian upright posture throughout his session, without the need for cues from PT.    Status Partially Met      PT LONG TERM GOAL #4   Title Pt will be able to verbalize desk/posture adjustments while seated at his computer, to decrease stiffness throughout the day.    Status Partially Met                 Plan - 03/09/20 0923    Clinical Impression Statement The patient reports he is satisfied with his present functional level with his neck and expresses readiness for discharge from PT.   He has responded well to dry needling and manual therapy to  cervical musculature.  He is limited in his exercise ability due to reports of increased lower back pain with any attempts of postural correction.  He has selected a few exercises to address his particularly limited cervical rotation but feels he needs to avoid other postural strengthening.  He had made some improvements in cervical ROM from start of care although more limited today which patient feels is the result of not doing any ex's during his recent back flare up.   No change in FOTO score.  Will discharge from PT at his request with partial progress with goals.    Rehab Potential Good    PT Frequency 2x / week    PT Duration 6 weeks    PT Treatment/Interventions ADLs/Self Care Home Management;Moist Heat;Electrical Stimulation;Therapeutic activities;Therapeutic exercise;Neuromuscular re-education;Manual techniques;Dry needling;Passive range of motion;Taping;Patient/family education    PT Home Exercise Plan Access Code: [email or web address]           Patient will benefit from skilled therapeutic intervention in order to improve the following deficits and impairments:  Pain, Postural dysfunction, Increased muscle spasms, Decreased mobility, Hypomobility, Decreased strength, Decreased range of motion, Impaired flexibility  Visit Diagnosis: Cervicalgia  Other muscle spasm  Abnormal posture  PHYSICAL THERAPY DISCHARGE SUMMARY  Visits from Start of Care: 5  Current functional level related to goals / functional outcomes: See clinical impressions above   Remaining deficits: As above   Education / Equipment: HEP Plan: Patient agrees to discharge.  Patient goals were partially met. Patient is being discharged due to being pleased with the current functional level.  ?????       Problem List Patient Active Problem List   Diagnosis Date Noted  . PAF (paroxysmal atrial fibrillation) ([place]) 04/20/2014  . H/O elevated lipids 08/11/2013  . Dizziness 06/09/2012  . Cerumen impaction 06/09/2012    . Nasal polyposis 05/18/2012  . Thyroiditis 07/04/2011  . PSVT 08/21/2010  . PALPITATIONS 05/04/2009  Ruben Im C 03/09/2020, 9:32 AM Ruben Im, PT 03/09/20 9:33 AM Phone: [phone] Fax: [facility] [address], [place], [place], [address] Phone: [phone]   Fax:  [phone]  Name: BRAHIM DOLMAN MRN: [id number] Date of Birth: 1950-09-21

## 2020-03-14 DIAGNOSIS — G4733 Obstructive sleep apnea (adult) (pediatric): Secondary | ICD-10-CM | POA: Diagnosis not present

## 2020-03-15 ENCOUNTER — Encounter: Payer: Medicare HMO | Admitting: Physical Therapy

## 2020-03-22 ENCOUNTER — Encounter: Payer: Medicare HMO | Admitting: Physical Therapy

## 2020-03-22 DIAGNOSIS — R69 Illness, unspecified: Secondary | ICD-10-CM | POA: Diagnosis not present

## 2020-03-29 ENCOUNTER — Encounter: Payer: Medicare HMO | Admitting: Physical Therapy

## 2020-04-05 ENCOUNTER — Encounter: Payer: Medicare HMO | Admitting: Physical Therapy

## 2020-04-17 DIAGNOSIS — G4733 Obstructive sleep apnea (adult) (pediatric): Secondary | ICD-10-CM | POA: Diagnosis not present

## 2020-05-01 ENCOUNTER — Ambulatory Visit: Payer: Medicare HMO | Admitting: Family Medicine

## 2020-05-01 ENCOUNTER — Ambulatory Visit (INDEPENDENT_AMBULATORY_CARE_PROVIDER_SITE_OTHER): Payer: Medicare HMO | Admitting: Family Medicine

## 2020-05-01 ENCOUNTER — Encounter: Payer: Self-pay | Admitting: Family Medicine

## 2020-05-01 ENCOUNTER — Other Ambulatory Visit: Payer: Self-pay

## 2020-05-01 VITALS — BP 118/68 | HR 70 | Temp 98.3°F | Wt 156.3 lb

## 2020-05-01 DIAGNOSIS — G2581 Restless legs syndrome: Secondary | ICD-10-CM

## 2020-05-01 DIAGNOSIS — M20011 Mallet finger of right finger(s): Secondary | ICD-10-CM | POA: Diagnosis not present

## 2020-05-01 MED ORDER — PRAMIPEXOLE DIHYDROCHLORIDE 0.125 MG PO TABS: ORAL_TABLET | ORAL | 5 refills | Status: DC

## 2020-05-01 NOTE — Progress Notes (Signed)
Established Patient Office Visit  Subjective:  Patient ID: Eric Kaufman, male    DOB: December 08, 1950  Age: 68 y.o. MRN: [id number]  CC:  Chief Complaint  Patient presents with  . Hand Pain    right small finger has been sore and swollen for about 6 months     HPI Eric Kaufman presents for the following items  He has had some swelling and flexion contracture of the DIP joint right fifth digit for about 6 months now. He thinks he may have jammed the finger but is not sure. He has had inability to fully extend at the DIP joint of fifth digit for several months now. He never had this splinted. Not functionally limiting at this point except for occasional difficulties with typing.  He has had some persistent swelling and mild pain involving the DIP joint of the same digit.  He describes probable restless leg symptoms. He has intermittent jerks at night right lower extremity. Relieved with walking. No real pain but sometimes his restless movements interfere with sleep. No caffeine use. No regular alcohol use. No known history of anemia.  At this point, symptoms only occurring at night  Past Medical History:  Diagnosis Date  . Anxiety   . Palpitations   . Papilledema   . PSVT   . Sleep apnea    mild uses cpap    Past Surgical History:  Procedure Laterality Date  . COLONOSCOPY    . heart ablation N/A 02/16/2015  . NASAL SINUS SURGERY  04/03/2015  . POLYPECTOMY      Family History  Problem Relation Age of Onset  . Lung cancer Father   . Cancer Father        lung  . Cancer Mother        colon  . Hypertension Mother   . Colon cancer Mother   . Colon polyps Sister   . Colon cancer Maternal Uncle   . Esophageal cancer Neg Hx   . Rectal cancer Neg Hx   . Stomach cancer Neg Hx     Social History   Socioeconomic History  . Marital status: Married    Spouse name: Not on file  . Number of children: Not on file  . Years of education: Not on file  . Highest education level:  Not on file  Occupational History  . 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 Smoking status: Never Smoker  . Smokeless tobacco: Never Used  Substance and Sexual Activity  . Alcohol use: Yes    Comment: rarely  . Drug use: No  . Sexual activity: Not on file  Other Topics Concern  . Not on file  Social History Narrative  . Not on file   Social Determinants of Health   Financial Resource Strain:   . Difficulty of Paying Living Expenses: Not on file  Food Insecurity:   . Worried About Charity fundraiser in the Last Year: Not on file  . Ran Out of Food in the Last Year: Not on file  Transportation Needs:   . Lack of Transportation (Medical): Not on file  . Lack of Transportation (Non-Medical): Not on file  Physical Activity:   . Days of Exercise per Week: Not on file  . Minutes of Exercise per Session: Not on file  Stress:   . Feeling of Stress : Not on file  Social Connections:   . Frequency of Communication with Friends and Family:  Not on file  . Frequency of Social Gatherings with Friends and Family: Not on file  . Attends Religious Services: Not on file  . Active Member of Clubs or Organizations: Not on file  . Attends Archivist Meetings: Not on file  . Marital Status: Not on file  Intimate Partner Violence:   . Fear of Current or Ex-Partner: Not on file  . Emotionally Abused: Not on file  . Physically Abused: Not on file  . Sexually Abused: Not on file    Outpatient Medications Prior to Visit  Medication Sig Dispense Refill  . atenolol (TENORMIN) 50 MG tablet Take 50 mg by mouth as needed (palpatations).     Facility-Administered Medications Prior to Visit  Medication Dose Route Frequency Provider Last Rate Last Admin  . 0.9 %  sodium chloride infusion  500 mL Intravenous Continuous Ladene Artist, MD        Allergies  Allergen Reactions  . Aspirin Swelling  . Penicillins     Per pt: unknown    ROS Review of Systems    Respiratory: Negative for cough and shortness of breath.   Cardiovascular: Negative for chest pain.  Neurological: Negative for seizures, weakness, numbness and headaches.      Objective:    Physical Exam Vitals reviewed.  Constitutional:      Appearance: Normal appearance.  Cardiovascular:     Rate and Rhythm: Normal rate and regular rhythm.  Pulmonary:     Effort: Pulmonary effort is normal.     Breath sounds: Normal breath sounds.  Musculoskeletal:     Comments: He has mallet deformity right fifth digit at the DIP joint. Inability to fully extend the joint. He does have some mild swelling but only minimal tenderness. Full range of motion other digits of the hand  Neurological:     General: No focal deficit present.     Mental Status: He is alert and oriented to person, place, and time.     Cranial Nerves: No cranial nerve deficit.     Motor: No weakness.     Gait: Gait normal.     BP 118/68 (BP Location: Left Arm, Patient Position: Sitting, Cuff Size: Normal)   Pulse 70   Temp 98.3 F (36.8 C) (Oral)   Wt 156 lb 4.8 oz (70.9 kg)   SpO2 95%   BMI 21.80 kg/m  Wt Readings from Last 3 Encounters:  05/01/20 156 lb 4.8 oz (70.9 kg)  11/30/19 160 lb 4.8 oz (72.7 kg)  11/04/17 155 lb (70.3 kg)     Health Maintenance Due  Topic Date Due  . Hepatitis C Screening  Never done  . COVID-19 Vaccine (1) Never done  . PNA vac Low Risk Adult (1 of 2 - PCV13) Never done  . INFLUENZA VACCINE  04/08/2020    There are no preventive care reminders to display for this patient.  Lab Results  Component Value Date   TSH 1.17 11/30/2019   Lab Results  Component Value Date   WBC 12.1 (H) 01/10/2016   HGB 16.8 01/10/2016   HCT 51.1 01/10/2016   MCV 94.1 01/10/2016   PLT 324 01/10/2016   Lab Results  Component Value Date   NA 139 11/30/2019   K 4.0 11/30/2019   CO2 32 11/30/2019   GLUCOSE 70 11/30/2019   BUN 15 11/30/2019   CREATININE 0.76 11/30/2019   BILITOT 0.9  08/18/2013   ALKPHOS 66 08/18/2013   AST 18 08/18/2013   ALT  20 08/18/2013   PROT 7.4 08/18/2013   ALBUMIN 4.2 08/18/2013   CALCIUM 9.1 11/30/2019   ANIONGAP 12 01/10/2016   GFR 102.10 11/30/2019   Lab Results  Component Value Date   CHOL 170 12/23/2013   Lab Results  Component Value Date   HDL 53 12/23/2013   Lab Results  Component Value Date   LDLCALC 96 12/23/2013   Lab Results  Component Value Date   TRIG 103 12/23/2013   Lab Results  Component Value Date   CHOLHDL 3 08/18/2013   No results found for: HGBA1C    Assessment & Plan:   #1 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 Unfortunately this has been going on for several months and not sure much can be done at this point.  -We discussed referral to hand orthopedic specialist as he wishes to at least consult with them to review options  #2 restless leg symptoms. -We discussed possible triggers such as caffeine -Discussed trial of Mirapex 0.125 mg 1-2 nightly as needed -Be in touch if this is not adequately controlling his symptoms  Meds ordered this encounter  Medications  . pramipexole (MIRAPEX) 0.125 MG tablet    Sig: Take 1-2 po at night as needed for restless leg symptoms.    Dispense:  60 tablet    Refill:  5    Follow-up: No follow-ups on file.    Carolann Littler, MD

## 2020-05-01 NOTE — Patient Instructions (Signed)
Mallet Finger  Mallet finger is an injury that occurs when an object hits the tip of your straightened finger or thumb. It is also known as baseball finger. The blow to your fingertip causes it to bend more than normal, which tears the cord that attaches to the tip of your finger (extensor tendon).  Your extensor tendon is what straightens the end of your finger. If this tendon is damaged, you will not be able to straighten your fingertip. Sometimes, a piece of bone may be pulled away with the tendon (avulsion injury), or the tendon may tear completely. In some cases, surgery may be required to repair the damage. What are the causes? Mallet finger is caused by a hard, direct hit to the tip of your finger or thumb. This injury often happens from getting hit in the finger with a hard ball, such as a baseball. What increases the risk? This injury is more likely to happen if you play a sport that uses a hard ball. What are the signs or symptoms? The main symptom of this injury is the inability to straighten the tip of your finger. You can manually straighten your fingertip with your other hand, but the finger cannot straighten on its own. Other symptoms may include:  Pain.  Swelling.  Bruising.  Blood under the fingernail. How is this diagnosed? Your health care provider may suspect mallet finger if you are not able to extend your fingertip, especially if you recently injured your hand. Your health care provider will do a physical exam. This may include X-rays to see if a piece of bone has been pulled away or if the finger joint has separated (dislocated). How is this treated? Mallet finger may be treated with:  A splint on your fingertip to keep it straight (extended) while the tendon heals.  Surgery to repair the tendon. This is done in severe cases. This may involve: ? Using a pin or screw to keep your finger extended and your tendon attached. ? Using a piece of tendon from another part of  your body (graft) to replace a torn tendon. Follow these instructions at home: If you have a splint:  Wear the splint as told by your health care provider. Remove it only as told by your health care provider.  Loosen the splint if your fingers tingle, become numb, or turn cold and blue.  Keep the splint clean.  If the splint is not waterproof: ? Do not let it get wet. ? Cover it with a watertight covering when you take a bath or a shower.  If you take your splint off to dry it or change it: ? Gently press your finger on a flat surface to keep it straight. Failing to do so may lead to a permanent injury, or force you to wear the splint for a longer period of time. ? Check the skin under the splint. Tell your health care provider if you notice a blister or red and raw skin. Managing pain, stiffness, and swelling   If directed, put ice on the injured area: ? If you have a removable splint, remove it as told by your health care provider. ? Put ice in a plastic bag. ? Place a towel between your skin and the bag. ? Leave the ice on for 20 minutes, 2-3 times a day.  Move your fingers often to avoid stiffness and to lessen swelling.  Raise (elevate)the injured hand above the level of your heart while you are sitting or  lying down. General instructions  Take over-the-counter and prescription medicines only as told by your health care provider.  Do not drive or use heavy machinery while taking prescription pain medicine.  Keep all follow-up visits as told by your health care provider. This is important. Contact a health care provider if:  You have pain or swelling that is getting worse.  Your finger feels cold.  You cannot extend your finger after treatment.  You notice that the skin under the splint is red, raw, or has a blister. Get help right away if:  Even after loosening your splint, your finger is: ? Very red and swollen. ? White or blue. ? Numb or  tingling. Summary  Mallet finger is an injury that occurs from a hard, direct hit to the tip of your finger or thumb.  The blow to your fingertip causes it to bend more than normal, tearing the tendon that straightens the end of your finger. You cannot straighten your fingertip if this tendon is torn.  This injury often happens from getting hit in the finger with a hard ball, such as a baseball.  Treatment will depend on how severe the injury is. You may need to wear a splint to keep the finger straight while it heals. A more severe injury may require surgery to repair the tendon. This information is not intended to replace advice given to you by your health care provider. Make sure you discuss any questions you have with your health care provider. Document Revised: 09/07/2017 Document Reviewed: 09/07/2017 Elsevier Patient Education  2020 [employer].  Consider consultation with hand specialist. I have used Dr Alfredo Bach, or [facility]

## 2020-05-09 ENCOUNTER — Telehealth: Payer: Self-pay | Admitting: Family Medicine

## 2020-05-09 DIAGNOSIS — M20011 Mallet finger of right finger(s): Secondary | ICD-10-CM

## 2020-05-09 NOTE — Telephone Encounter (Signed)
Referral placed.

## 2020-05-09 NOTE — Telephone Encounter (Signed)
Called pt to advise of update. Pt verbalized understanding.

## 2020-05-09 NOTE — Telephone Encounter (Signed)
Pt stated he was told by his PCP that he would refer him to an Orthopedic for his finger. Pt has decided on which one he would like to see- Dr. Roseanne Kaufman [phone] Located on [address]  Pt would like a call back at [phone]

## 2020-06-01 ENCOUNTER — Ambulatory Visit (INDEPENDENT_AMBULATORY_CARE_PROVIDER_SITE_OTHER): Payer: Medicare HMO | Admitting: Family Medicine

## 2020-06-01 ENCOUNTER — Other Ambulatory Visit: Payer: Self-pay

## 2020-06-01 VITALS — BP 110/50 | HR 50 | Temp 97.8°F | Wt 155.3 lb

## 2020-06-01 DIAGNOSIS — R002 Palpitations: Secondary | ICD-10-CM | POA: Diagnosis not present

## 2020-06-01 DIAGNOSIS — H6122 Impacted cerumen, left ear: Secondary | ICD-10-CM

## 2020-06-01 NOTE — Progress Notes (Signed)
Established Patient Office Visit  Subjective:  Patient ID: Eric Kaufman, male    DOB: 05-24-1951  Age: 69 y.o. MRN: [id number]  CC:  Chief Complaint  Patient presents with  . Ear Fullness    HPI Eric Kaufman presents for the following items.  He has left ear fullness.  He has had little bit of involvement right ear but mostly the left ear.  Had some recent sinus congestion but he is fairly certain this is cerumen related.  He has pulled out some cerumen himself.  No dizziness.  No ear pain.  Had some recent palpitations.  He feels like these are premature beats.  He has had A. fib but states this feels different.  Has had previous ablation 2016.  He has atenolol which he takes as needed.  He did take an Advil PM last night and wonders if this may have triggered.  No alcohol use.  No caffeine use.  No recent chest pains or dizziness.  The atenolol did seem to help  Past Medical History:  Diagnosis Date  . Anxiety   . Palpitations   . Papilledema   . PSVT   . Sleep apnea    mild uses cpap    Past Surgical History:  Procedure Laterality Date  . COLONOSCOPY    . heart ablation N/A 02/16/2015  . NASAL SINUS SURGERY  04/03/2015  . POLYPECTOMY      Family History  Problem Relation Age of Onset  . Lung cancer Father   . Cancer Father        lung  . Cancer Mother        colon  . Hypertension Mother   . Colon cancer Mother   . Colon polyps Sister   . Colon cancer Maternal Uncle   . Esophageal cancer Neg Hx   . Rectal cancer Neg Hx   . Stomach cancer Neg Hx     Social History   Socioeconomic History  . Marital status: Married    Spouse name: Not on file  . Number of children: Not on file  . Years of education: Not on file  . Highest education level: Not on file  Occupational History  . 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 Smoking status: Never Smoker  . Smokeless tobacco: Never Used  Substance and Sexual Activity  . Alcohol use:  Yes    Comment: rarely  . Drug use: No  . Sexual activity: Not on file  Other Topics Concern  . Not on file  Social History Narrative  . Not on file   Social Determinants of Health   Financial Resource Strain:   . Difficulty of Paying Living Expenses: Not on file  Food Insecurity:   . Worried About Charity fundraiser in the Last Year: Not on file  . Ran Out of Food in the Last Year: Not on file  Transportation Needs:   . Lack of Transportation (Medical): Not on file  . Lack of Transportation (Non-Medical): Not on file  Physical Activity:   . Days of Exercise per Week: Not on file  . Minutes of Exercise per Session: Not on file  Stress:   . Feeling of Stress : Not on file  Social Connections:   . Frequency of Communication with Friends and Family: Not on file  . Frequency of Social Gatherings with Friends and Family: Not on file  . Attends Religious Services: Not on file  .  Active Member of Clubs or Organizations: Not on file  . Attends Archivist Meetings: Not on file  . Marital Status: Not on file  Intimate Partner Violence:   . Fear of Current or Ex-Partner: Not on file  . Emotionally Abused: Not on file  . Physically Abused: Not on file  . Sexually Abused: Not on file    Outpatient Medications Prior to Visit  Medication Sig Dispense Refill  . atenolol (TENORMIN) 50 MG tablet Take 50 mg by mouth as needed (palpatations).    . pramipexole (MIRAPEX) 0.125 MG tablet Take 1-2 po at night as needed for restless leg symptoms. 60 tablet 5   Facility-Administered Medications Prior to Visit  Medication Dose Route Frequency Provider Last Rate Last Admin  . 0.9 %  sodium chloride infusion  500 mL Intravenous Continuous Ladene Artist, MD        Allergies  Allergen Reactions  . Aspirin Swelling  . Penicillins     Per pt: unknown    ROS Review of Systems    Objective:    Physical Exam HENT:     Ears:     Comments: Right canal is clear.  He has cerumen  impaction left canal Cardiovascular:     Comments: Mostly regular but does have occasional premature beat Pulmonary:     Effort: Pulmonary effort is normal.     Breath sounds: Normal breath sounds.     BP (!) 110/50 (BP Location: Left Arm, Patient Position: Sitting, Cuff Size: Normal)   Pulse (!) 50   Temp 97.8 F (36.6 C) (Oral)   Wt 155 lb 4.8 oz (70.4 kg)   SpO2 97%   BMI 21.66 kg/m  Wt Readings from Last 3 Encounters:  06/01/20 155 lb 4.8 oz (70.4 kg)  05/01/20 156 lb 4.8 oz (70.9 kg)  11/30/19 160 lb 4.8 oz (72.7 kg)     Health Maintenance Due  Topic Date Due  . Hepatitis C Screening  Never done  . COVID-19 Vaccine (1) Never done  . PNA vac Low Risk Adult (1 of 2 - PCV13) Never done  . INFLUENZA VACCINE  Never done    There are no preventive care reminders to display for this patient.  Lab Results  Component Value Date   TSH 1.17 11/30/2019   Lab Results  Component Value Date   WBC 12.1 (H) 01/10/2016   HGB 16.8 01/10/2016   HCT 51.1 01/10/2016   MCV 94.1 01/10/2016   PLT 324 01/10/2016   Lab Results  Component Value Date   NA 139 11/30/2019   K 4.0 11/30/2019   CO2 32 11/30/2019   GLUCOSE 70 11/30/2019   BUN 15 11/30/2019   CREATININE 0.76 11/30/2019   BILITOT 0.9 08/18/2013   ALKPHOS 66 08/18/2013   AST 18 08/18/2013   ALT 20 08/18/2013   PROT 7.4 08/18/2013   ALBUMIN 4.2 08/18/2013   CALCIUM 9.1 11/30/2019   ANIONGAP 12 01/10/2016   GFR 102.10 11/30/2019   Lab Results  Component Value Date   CHOL 170 12/23/2013   Lab Results  Component Value Date   HDL 53 12/23/2013   Lab Results  Component Value Date   LDLCALC 96 12/23/2013   Lab Results  Component Value Date   TRIG 103 12/23/2013   Lab Results  Component Value Date   CHOLHDL 3 08/18/2013   No results found for: HGBA1C    Assessment & Plan:   #1 cerumen impaction left canal  -We  recommended removal and reviewed risk of curette and/or irrigation.  Pt consented. We used  a curette to remove majority of cerumen but he had some residual cerumen against the ear drum.  Gentle irrigation with removal remainder of cerumen.  He tolerated well.    #2 intermittent palpitations.  Suspect PACs or PVCs  -Continue to avoid alcohol and caffeine -Continue atenolol as needed  No orders of the defined types were placed in this encounter.   Follow-up: No follow-ups on file.    Carolann Littler, MD

## 2020-06-01 NOTE — Patient Instructions (Signed)
Premature Atrial Contraction  A premature atrial contraction [facility]) is a kind of irregular heartbeat (arrhythmia). It happens when the heart beats too early and then pauses before beating again. The heart has four areas, or chambers. Normally, electrical signals spread across the heart and make all the chambers beat together. During a PAC, the upper chambers of the heart (atria) beat too early, before they have had time to fill with blood. The heartbeat pauses afterward so the heart can fill with blood for the next beat. Sometimes PAC can be a warning sign of another type of arrhythmia called atrial fibrillation. Atrial fibrillation may allow blood to pool in the atria and form clots. If a clot travels to the brain, it can cause a stroke. What are the causes? The cause of this condition is often unknown. Sometimes, this condition may be caused by heart disease or injury to the heart. What increases the risk? You are more likely to develop this condition if:  You are a child.  You are an adult who is 68 years of age or older. Episodes may be triggered by:  Caffeine.  Alcohol.  Tobacco use.  Stimulant drugs.  Some medicines or supplements.  Stress.  Heart disease. What are the signs or symptoms? Symptoms of this condition include:  A feeling that your heart skipped a beat. The first heartbeat after the "skipped" beat may feel more forceful.  A feeling that your heart is fluttering. How is this diagnosed? This condition is diagnosed based on:  Your symptoms.  A physical exam. Your health care provider may listen to your heart.  An electrocardiogram (ECG). This is a test that records the electrical impulses of the heart.  An ambulatory cardiac monitor. This device records your heartbeats for 24 hours or more. You may also have:  An echocardiogram to check for any heart conditions. This is a type of imaging test that uses sound waves (ultrasound) to make images of your  heart.  Blood tests. How is this treated? Treatment depends on the frequency of your symptoms and other risk factors. Treatments may include:  Medicines (beta-blockers).  Catheter ablation. This is done to destroy the part of the heart tissue that sends abnormal signals. In some cases, treatment may not be needed for this condition. Follow these instructions at home: Lifestyle  Do not use any products that contain nicotine or tobacco, such as cigarettes, e-cigarettes, and chewing tobacco. If you need help quitting, ask your health care provider.  Exercise regularly. Ask your health care provider what type of exercise is safe for you.  Find healthy ways to manage stress.  Try to get at least 7-9 hours of sleep each night, or as much as recommended by your health care provider. Alcohol use  Do not drink alcohol if: ? Your health care provider tells you not to drink. ? You are pregnant, may be pregnant, or are planning to become pregnant. ? Alcohol triggers your episodes.  If you drink alcohol: ? Limit how much you use to:  0-1 drink a day for women.  0-2 drinks a day for men. ? Be aware of how much alcohol is in your drink. In the [place] one drink equals one 12 oz bottle of beer (355 mL), one 5 oz glass of wine (148 mL), or one 1 oz glass of hard liquor (44 mL). General instructions  Take over-the-counter and prescription medicines only as told by your health care provider.  If caffeine triggers episodes, do not eat,  drink, or use anything with caffeine in it.  Keep all follow-up visits as told by your health care provider. This is important. Contact a health care provider if:  You feel your heart skipping beats.  Your heart skips beats and you feel dizzy, light-headed, or very tired. Get help right away if you have:  Chest pain.  Trouble breathing.  Any symptoms of a stroke. "BE FAST" is an easy way to remember the main warning signs of a stroke. ? B - Balance.  Signs are dizziness, sudden trouble walking, or loss of balance. ? E - Eyes. Signs are trouble seeing or a sudden change in vision. ? F - Face. Signs are sudden weakness or numbness of the face, or the face or eyelid drooping on one side. ? A - Arms. Signs are weakness or numbness in an arm. This happens suddenly and usually on one side of the body. ? S - Speech. Signs are sudden trouble speaking, slurred speech, or trouble understanding what people say. ? T - Time. Time to call emergency services. Write down what time symptoms started.  Other signs of stroke, such as: ? A sudden, severe headache with no known cause. ? Nausea or vomiting. ? Seizure. These symptoms may represent a serious problem that is an emergency. Do not wait to see if the symptoms will go away. Get medical help right away. Call your local emergency services (911 in the [place] Do not drive yourself to the hospital. Summary  A premature atrial contraction [facility]) is a kind of irregular heartbeat (arrhythmia). It happens when the heart beats too early and then pauses before beating again.  Treatment depends on your symptoms and whether you have other underlying heart conditions.  Contact a health care provider if your heart skips beats and you feel dizzy, light-headed, or very tired.  In some cases, this condition may lead to a stroke. "BE FAST" is an easy way to remember the warning signs of stroke. Get help right away if you have any of the "BE FAST" signs. This information is not intended to replace advice given to you by your health care provider. Make sure you discuss any questions you have with your health care provider. Document Revised: 05/20/2018 Document Reviewed: 05/20/2018 Elsevier Patient Education  2020 [employer].

## 2020-06-13 DIAGNOSIS — M13849 Other specified arthritis, unspecified hand: Secondary | ICD-10-CM | POA: Diagnosis not present

## 2020-06-13 DIAGNOSIS — M13841 Other specified arthritis, right hand: Secondary | ICD-10-CM | POA: Diagnosis not present

## 2020-06-13 DIAGNOSIS — M79644 Pain in right finger(s): Secondary | ICD-10-CM | POA: Diagnosis not present

## 2020-07-03 DIAGNOSIS — G4733 Obstructive sleep apnea (adult) (pediatric): Secondary | ICD-10-CM | POA: Diagnosis not present

## 2020-08-03 DIAGNOSIS — G4733 Obstructive sleep apnea (adult) (pediatric): Secondary | ICD-10-CM | POA: Diagnosis not present

## 2020-08-06 DIAGNOSIS — G4733 Obstructive sleep apnea (adult) (pediatric): Secondary | ICD-10-CM | POA: Diagnosis not present

## 2020-08-20 ENCOUNTER — Encounter: Payer: Self-pay | Admitting: Family Medicine

## 2020-08-20 DIAGNOSIS — M542 Cervicalgia: Secondary | ICD-10-CM

## 2020-08-21 ENCOUNTER — Ambulatory Visit: Payer: Medicare HMO

## 2020-09-02 DIAGNOSIS — G4733 Obstructive sleep apnea (adult) (pediatric): Secondary | ICD-10-CM | POA: Diagnosis not present

## 2020-09-21 ENCOUNTER — Encounter: Payer: Self-pay | Admitting: Physical Therapy

## 2020-09-21 ENCOUNTER — Ambulatory Visit: Payer: Medicare HMO | Attending: Family Medicine | Admitting: Physical Therapy

## 2020-09-21 ENCOUNTER — Other Ambulatory Visit: Payer: Self-pay

## 2020-09-21 DIAGNOSIS — R293 Abnormal posture: Secondary | ICD-10-CM | POA: Diagnosis not present

## 2020-09-21 DIAGNOSIS — M62838 Other muscle spasm: Secondary | ICD-10-CM | POA: Diagnosis not present

## 2020-09-21 DIAGNOSIS — M542 Cervicalgia: Secondary | ICD-10-CM

## 2020-09-21 NOTE — Patient Instructions (Signed)
     Trigger Point Dry Needling  . What is Trigger Point Dry Needling (DN)? o DN is a physical therapy technique used to treat muscle pain and dysfunction. Specifically, DN helps deactivate muscle trigger points (muscle knots).  o A thin filiform needle is used to penetrate the skin and stimulate the underlying trigger point. The goal is for a local twitch response (LTR) to occur and for the trigger point to relax. No medication of any kind is injected during the procedure.   . What Does Trigger Point Dry Needling Feel Like?  o The procedure feels different for each individual patient. Some patients report that they do not actually feel the needle enter the skin and overall the process is not painful. Very mild bleeding may occur. However, many patients feel a deep cramping in the muscle in which the needle was inserted. This is the local twitch response.   . How Will I feel after the treatment? o Soreness is normal, and the onset of soreness may not occur for a few hours. Typically this soreness does not last longer than two days.  o Bruising is uncommon, however; ice can be used to decrease any possible bruising.  o In rare cases feeling tired or nauseous after the treatment is normal. In addition, your symptoms may get worse before they get better, this period will typically not last longer than 24 hours.   . What Can I do After My Treatment? o Increase your hydration by drinking more water for the next 24 hours. o You may place ice or heat on the areas treated that have become sore, however, do not use heat on inflamed or bruised areas. Heat often brings more relief post needling. o You can continue your regular activities, but vigorous activity is not recommended initially after the treatment for 24 hours. o DN is best combined with other physical therapy such as strengthening, stretching, and other therapies.    Stacy Simpson PT [facility] [address], [address] , [place] [address] Phone # [phone] Fax [phone] 

## 2020-09-21 NOTE — Therapy (Signed)
[facility] [address] [place], [place], [address] Phone: [phone]   Fax:  [phone]  Physical Therapy Evaluation  Patient Details  Name: Eric Kaufman MRN: [id number] Date of Birth: Nov 19, 1950 Referring Provider (PT): Dr. Carolann Littler   Encounter Date: 09/21/2020   PT End of Session - 09/21/20 1244    Visit Number 1    Date for PT Re-Evaluation 12/14/20    Authorization Type Aetna Medicare    PT Start Time 0845    PT Stop Time 0927    PT Time Calculation (min) 42 min    Activity Tolerance Patient tolerated treatment well           Past Medical History:  Diagnosis Date  . Anxiety   . Palpitations   . Papilledema   . PSVT   . Sleep apnea    mild uses cpap    Past Surgical History:  Procedure Laterality Date  . COLONOSCOPY    . heart ablation N/A 02/16/2015  . NASAL SINUS SURGERY  04/03/2015  . POLYPECTOMY      There were no vitals filed for this visit.    Subjective Assessment - 09/21/20 0849    Subjective Chronic history of neck pain and previous patient at this facility for treatment for neck May-July.  I do the towel rotation, chin tucks and ball exercises but I got lax.  I think the Dn in the past helped.  Worse in the winters.  Feel it base of skull both sides.    Pertinent History LBP chronic easily exacerbated    Limitations Sitting;Other (comment)    How long can you sit comfortably? 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 Yes    Pain Score 3     Pain Location Neck    Pain Orientation Right;Left    Pain Type Chronic pain    Pain Radiating Towards no UE symptoms    Pain Onset More than a month ago    Pain Frequency Intermittent    Aggravating Factors   turning side to side; colder months; using the computer (protruded forward)    Pain Relieving Factors DN in the past              [facility] PT Assessment - 09/21/20 0001      Assessment   Medical Diagnosis cervical pain    Referring Provider (PT) Dr. Darnell Level [name]    Onset Date/Surgical Date --   > 6 months   Next MD Visit as needed    Prior Therapy for neck      Precautions   Precautions None      Restrictions   Weight Bearing Restrictions No      Balance Screen   Has the patient fallen in the past 6 months No    Has the patient had a decrease in activity level because of a fear of falling?  No    Is the patient reluctant to leave their home because of a fear of falling?  No      Home Ecologist residence      Prior Function   Level of Independence Independent    Vocation Requirements editing books  Leisure painting      Observation/Other Assessments   Focus on Therapeutic Outcomes (FOTO)  71%      Posture/Postural Control   Posture/Postural Control Postural limitations    Postural Limitations Rounded Shoulders;Forward head;Increased thoracic kyphosis      AROM   Cervical Flexion 60    Cervical Extension 35    Cervical - Right Side Bend 10    Cervical - Left Side Bend 14    Cervical - Right Rotation 8    Cervical - Left Rotation 10      Strength   Overall Strength Comments strength in cervical, periscapular and thoracic regions grossly 4+/5      Palpation   Palpation comment tender points in bil upper traps and cervical paraspinals; decreased suboccipital lengths                      Objective measurements completed on examination: See above findings.       [place] [facility] [facility] PT Treatment/Exercise - 09/21/20 0001      Moist Heat Therapy   Number Minutes Moist Heat 3 Minutes    Moist Heat Location Cervical      Manual Therapy   Soft tissue mobilization bil upper traps, cervical paraspinals, suboccipitals             Trigger Point Dry Needling - 09/21/20 0001    Consent Given? Yes    Education Handout Provided Previously provided    Muscles Treated Head and Neck Upper trapezius;Cervical multifidi;Suboccipitals    Other Dry Needling bil    Upper Trapezius Response Twitch reponse elicited;Palpable increased muscle length    Suboccipitals Response Palpable increased muscle length    Cervical multifidi Response Palpable increased muscle length                PT Education - 09/21/20 1242    Education Details dry needling after care    Person(s) Educated Patient    Methods Explanation    Comprehension Verbalized understanding            PT Short Term Goals - 09/21/20 1254      PT SHORT TERM GOAL #1   Title Pt will be independent with his initial HEP to increase cervical ROM    Time 6    Period Weeks    Status New    Target Date 11/02/20             PT Long Term Goals - 09/21/20 1254      PT LONG TERM GOAL #1   Title Pt will have atleast 30 deg of active cervical rotation to improve his ability to turn his head while driving    Time 12    Period Weeks    Status New    Target Date 12/14/20      PT LONG TERM GOAL #2   Title Pt will report atleast 50% improvement in his neck pain and stiffness from the start of PT.    Time 12    Period Weeks    Status New      PT LONG TERM GOAL #3   Title The patient will have improved cervical extension to 45 and bil sidebending ROM to 20 degrees needed for driving    Time 12    Period Weeks    Status New      PT LONG TERM GOAL #4   Title Pt will be able to verbalize desk/posture adjustments while seated at his computer  and painting , to decrease stiffness throughout the day.    Time 12    Period Weeks    Status New      PT LONG TERM GOAL #5   Title FOTO score improved to at least 72% indicating a high level of function with ADLs    Time 12    Period Weeks    Status New                  Plan - 09/21/20 1245     Clinical Impression Statement The patient has a history of bilateral neck pain from upper trap muscles to the base of his head which is aggravated with turning his head side to side, in the colder months sitting to paint, doing computer work.  He is a previous patient of this facility from May to early July for treatment of this issue.  He reports he got relief from dry needling but than most of the exercises caused a flare up of his back.   Poor sitting posture with head forward/increased thoracic kyphosis. Limited cervical ROM in all planes but particularly sidebending and rotation (much more limited than last visit in July).  Tender points and soft tissue restriction noted in bil upper traps, cervical paraspinals and suboccipitals.  He would benefit from PT to address these deficits.    Personal Factors and Comorbidities Comorbidity 1;Past/Current Experience;Time since onset of injury/illness/exacerbation    Comorbidities LBP easily aggravated with neck ex's in the past;  chronic history    Examination-Activity Limitations Sit;Other    Examination-Participation Restrictions Other    Stability/Clinical Decision Making Stable/Uncomplicated    Clinical Decision Making Low    Rehab Potential Good    PT Frequency 1x / week    PT Duration 12 weeks    PT Treatment/Interventions ADLs/Self Care Home Management;Electrical Stimulation;Moist Heat;Traction;Ultrasound;Neuromuscular re-education;Therapeutic exercise;Therapeutic activities;Manual techniques;Patient/family education;Dry needling;Taping    PT Next Visit Plan assess response to Dn #1; manual therapy; soft tissue mob, cervical joint mobs;  low tolerance for exercise secondary to LBP    Consulted and Agree with Plan of Care Patient           Patient will benefit from skilled therapeutic intervention in order to improve the following deficits and impairments:  Increased fascial restricitons,Decreased range of motion,Increased muscle  spasms,Pain,Postural dysfunction  Visit Diagnosis: Cervicalgia - Plan: PT plan of care cert/re-cert  Other muscle spasm - Plan: PT plan of care cert/re-cert  Abnormal posture - Plan: PT plan of care cert/re-cert     Problem List Patient Active Problem List   Diagnosis Date Noted  . PAF (paroxysmal atrial fibrillation) ([place]) 04/20/2014  . H/O elevated lipids 08/11/2013  . Dizziness 06/09/2012  . Cerumen impaction 06/09/2012  . Nasal polyposis 05/18/2012  . Thyroiditis 07/04/2011  . PSVT 08/21/2010  . PALPITATIONS 05/04/2009   Ruben Im, PT 09/21/20 1:11 PM Phone: [phone] Fax: [phone] Alvera Singh 09/21/2020, 1:10 PM  [facility] [address], [place], [place], [address] Phone: [phone]   Fax:  [phone]  Name: VALTON HAPP MRN: [id number] Date of Birth: 10-17-1950

## 2020-10-09 ENCOUNTER — Ambulatory Visit: Payer: Medicare HMO | Attending: Family Medicine | Admitting: Physical Therapy

## 2020-10-09 ENCOUNTER — Other Ambulatory Visit: Payer: Self-pay

## 2020-10-09 DIAGNOSIS — R293 Abnormal posture: Secondary | ICD-10-CM | POA: Insufficient documentation

## 2020-10-09 DIAGNOSIS — M62838 Other muscle spasm: Secondary | ICD-10-CM | POA: Diagnosis not present

## 2020-10-09 DIAGNOSIS — M542 Cervicalgia: Secondary | ICD-10-CM | POA: Diagnosis not present

## 2020-10-09 NOTE — Therapy (Signed)
[facility] [address] [place], [place], [address] Phone: [phone]   Fax:  [phone]  Physical Therapy Treatment  Patient Details  Name: Eric Kaufman MRN: [id number] Date of Birth: July 18, 1951 Referring Provider (PT): Dr. Carolann Littler   Encounter Date: 10/09/2020   PT End of Session - 10/09/20 1642    Visit Number 2    Date for PT Re-Evaluation 12/14/20    Authorization Type Aetna Medicare    PT Start Time 1324    PT Stop Time 1655   heat, DN   PT Time Calculation (min) 40 min    Activity Tolerance Patient tolerated treatment well           Past Medical History:  Diagnosis Date  . Anxiety   . Palpitations   . Papilledema   . PSVT   . Sleep apnea    mild uses cpap    Past Surgical History:  Procedure Laterality Date  . COLONOSCOPY    . heart ablation N/A 02/16/2015  . NASAL SINUS SURGERY  04/03/2015  . POLYPECTOMY      There were no vitals filed for this visit.   Subjective Assessment - 10/09/20 1618    Subjective The needling may have been helpful.  Too cold to paint so I'm on the computer more.    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 No/denies    Pain Score 3    with turning   Pain Location Neck    Pain Orientation Right;Left    Pain Type Chronic pain    Pain Frequency Intermittent    Aggravating Factors  turning side to side;                             [facility] Adult PT Treatment/Exercise - 10/09/20 0001      Moist Heat Therapy   Number Minutes Moist Heat 3 Minutes    Moist Heat Location Cervical      Electrical Stimulation   Electrical Stimulation Location bil cervical multifidi     Electrical Stimulation Action pre mod    Electrical Stimulation Parameters 1.5 8 min    Electrical Stimulation Goals Pain      Manual Therapy   Soft tissue mobilization bil upper traps, cervical paraspinals, suboccipitals            Trigger Point Dry Needling - 10/09/20 0001    Consent Given? Yes    Electrical Stimulation Performed with Dry Needling Yes    E-stim with Dry Needling Details bil multifidi    Upper Trapezius Response Twitch reponse elicited;Palpable increased muscle length    Suboccipitals Response Palpable increased muscle length    Cervical multifidi Response Palpable increased muscle length                  PT Short Term Goals - 09/21/20 1254      PT SHORT TERM GOAL #1   Title Pt will be independent with his initial HEP to increase cervical ROM    Time 6    Period Weeks    Status New    Target Date 11/02/20  PT Long Term Goals - 09/21/20 1254      PT LONG TERM GOAL #1   Title Pt will have atleast 30 deg of active cervical rotation to improve his ability to turn his head while driving    Time 12    Period Weeks    Status New    Target Date 12/14/20      PT LONG TERM GOAL #2   Title Pt will report atleast 50% improvement in his neck pain and stiffness from the start of PT.    Time 12    Period Weeks    Status New      PT LONG TERM GOAL #3   Title The patient will have improved cervical extension to 45 and bil sidebending ROM to 20 degrees needed for driving    Time 12    Period Weeks    Status New      PT LONG TERM GOAL #4   Title Pt will be able to verbalize desk/posture adjustments while seated at his computer and painting , to decrease stiffness throughout the day.    Time 12    Period Weeks    Status New      PT LONG TERM GOAL #5   Title FOTO score improved to at least 72% indicating a high level of function with ADLs    Time 12    Period Weeks    Status New                 Plan - 10/09/20 1644     Clinical Impression Statement The patient is interested in continuation of DN to address chronic myofascial tightness and pain.  Added ES to DN for further pain relief.    He has difficulty tolerating the prone position primarily due to LBP and is concerned any exercise will cause his back to go out.  He is able to do cervical retractions and rotation SNAG without aggravation to low back.  Therapist monitoring response throughout session and adding extra pillow and towel rolls for comfort.    Personal Factors and Comorbidities Comorbidity 1;Past/Current Experience;Time since onset of injury/illness/exacerbation    Comorbidities LBP easily aggravated with neck ex's in the past;  chronic history    Rehab Potential Good    PT Frequency 1x / week    PT Duration 12 weeks    PT Treatment/Interventions ADLs/Self Care Home Management;Electrical Stimulation;Moist Heat;Traction;Ultrasound;Neuromuscular re-education;Therapeutic exercise;Therapeutic activities;Manual techniques;Patient/family education;Dry needling;Taping    PT Next Visit Plan assess response to Dn #2 with added ES; manual therapy; soft tissue mob, cervical joint mobs;  low tolerance for exercise secondary to LBP    Consulted and Agree with Plan of Care Patient           Patient will benefit from skilled therapeutic intervention in order to improve the following deficits and impairments:  Increased fascial restricitons,Decreased range of motion,Increased muscle spasms,Pain,Postural dysfunction  Visit Diagnosis: Cervicalgia  Other muscle spasm  Abnormal posture     Problem List Patient Active Problem List   Diagnosis Date Noted  . PAF (paroxysmal atrial fibrillation) ([place]) 04/20/2014  . H/O elevated lipids 08/11/2013  . Dizziness 06/09/2012  . Cerumen impaction 06/09/2012  . Nasal polyposis 05/18/2012  . Thyroiditis 07/04/2011  . PSVT 08/21/2010  . PALPITATIONS 05/04/2009   Ruben Im, PT 10/09/20 4:55 PM Phone:  [phone] Fax: [phone] Alvera Singh 10/09/2020, 4:54 PM  [facility] [address], [place], [place],  [address] Phone: [phone]   Fax:  [phone]  Name: ERMAL HABERER MRN: [id number] Date of Birth: 1951/04/14

## 2020-10-16 ENCOUNTER — Ambulatory Visit: Payer: Medicare HMO | Admitting: Physical Therapy

## 2020-10-16 ENCOUNTER — Other Ambulatory Visit: Payer: Self-pay

## 2020-10-16 DIAGNOSIS — R293 Abnormal posture: Secondary | ICD-10-CM

## 2020-10-16 DIAGNOSIS — M542 Cervicalgia: Secondary | ICD-10-CM

## 2020-10-16 DIAGNOSIS — M62838 Other muscle spasm: Secondary | ICD-10-CM | POA: Diagnosis not present

## 2020-10-16 NOTE — Therapy (Addendum)
[facility] [address] [place], [place], [address] Phone: [phone]   Fax:  [phone]  Physical Therapy Treatment/Discharge Summary   Patient Details  Name: Eric Kaufman MRN: [id number] Date of Birth: 1951-02-26 Referring Provider (PT): Dr. Carolann Littler   Encounter Date: 10/16/2020   PT End of Session - 10/16/20 1513    Visit Number 3    Date for PT Re-Evaluation 12/14/20    Authorization Type Aetna Medicare    PT Start Time 5361    PT Stop Time 4431   DN; declines ex   PT Time Calculation (min) 25 min    Activity Tolerance Patient tolerated treatment well           Past Medical History:  Diagnosis Date  . Anxiety   . Palpitations   . Papilledema   . PSVT   . Sleep apnea    mild uses cpap    Past Surgical History:  Procedure Laterality Date  . COLONOSCOPY    . heart ablation N/A 02/16/2015  . NASAL SINUS SURGERY  04/03/2015  . POLYPECTOMY      There were no vitals filed for this visit.   Subjective Assessment - 10/16/20 1446    Subjective I didn't notice a difference with the added ES to DN.  Movement helps the most.  I want to do the DN today and see how the warmer weather does.  Took 1 Advil this morning and that really helped.  Mostly just stiff.    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 Yes    Pain Score 1     Pain Location Neck    Pain Orientation Right;Left    Pain Type Chronic pain                             [facility] Adult PT Treatment/Exercise - 10/16/20 0001      Self-Care   Other Self-Care Comments  discussion of self care strategies including tennis ball suboccipital release, use of  heat to enhance suboccipital release; review of radiology report and findings/interpretation      Manual Therapy   Soft tissue mobilization bil cervical paraspinals, suboccipitals            Trigger Point Dry Needling - 10/16/20 0001    Consent Given? Yes    Electrical Stimulation Performed with Dry Needling Yes    E-stim with Dry Needling Details bil multifidi    Upper Trapezius Response --    Suboccipitals Response Palpable increased muscle length    Cervical multifidi Response Palpable increased muscle length                  PT Short Term Goals - 09/21/20 1254      PT SHORT TERM GOAL #1   Title Pt will be independent with his initial HEP to increase cervical ROM    Time 6    Period Weeks    Status New    Target Date 11/02/20             PT Long Term Goals - 09/21/20 1254  PT LONG TERM GOAL #1   Title Pt will have atleast 30 deg of active cervical rotation to improve his ability to turn his head while driving    Time 12    Period Weeks    Status New    Target Date 12/14/20      PT LONG TERM GOAL #2   Title Pt will report atleast 50% improvement in his neck pain and stiffness from the start of PT.    Time 12    Period Weeks    Status New      PT LONG TERM GOAL #3   Title The patient will have improved cervical extension to 45 and bil sidebending ROM to 20 degrees needed for driving    Time 12    Period Weeks    Status New      PT LONG TERM GOAL #4   Title Pt will be able to verbalize desk/posture adjustments while seated at his computer and painting , to decrease stiffness throughout the day.    Time 12    Period Weeks    Status New      PT LONG TERM GOAL #5   Title FOTO score improved to at least 72% indicating a high level of function with ADLs    Time 12    Period Weeks    Status New                 Plan - 10/16/20 1514    Clinical Impression Statement The patient anticipates he will be feeling better as warmer weather  returns and he will be more active with less time on the computer.  He does not wish to pursue other exercise at this time secondary to high irritibality of his lower back.  Treatment focus on soft tissue/myofascial release particularly of the suboccipital muscles.  Discussed strategies to maintain this lengthening.  He may cancel his next 2 appts if doing well and follow up in March or early April if needed.    Comorbidities LBP easily aggravated with neck ex's in the past;  chronic history    PT Frequency 1x / week    PT Duration 12 weeks    PT Treatment/Interventions ADLs/Self Care Home Management;Electrical Stimulation;Moist Heat;Traction;Ultrasound;Neuromuscular re-education;Therapeutic exercise;Therapeutic activities;Manual techniques;Patient/family education;Dry needling;Taping    PT Next Visit Plan recheck cervical ROM;  DN suboccipitals, cervical mutifidi;  no change with ES/DN; low tolerance for ex secondary to LBP; patient may cancel his next 2 appts and will follow up before 4/8 if needed           Patient will benefit from skilled therapeutic intervention in order to improve the following deficits and impairments:  Increased fascial restricitons,Decreased range of motion,Increased muscle spasms,Pain,Postural dysfunction  Visit Diagnosis: Cervicalgia  Other muscle spasm  Abnormal posture    PHYSICAL THERAPY DISCHARGE SUMMARY  Visits from Start of Care: 3  Current functional level related to goals / functional outcomes: The patient called to request being on hold from PT and cancelled his follow up appts.  He has not called back to resume PT and will discharge from PT at this time.     Remaining deficits: As above   Education / Equipment: HEP Plan:  Patient goals were not met. Patient is being discharged due to not returning since the last visit.  ?????        Problem List Patient Active Problem List   Diagnosis  Date Noted  . PAF (paroxysmal atrial fibrillation) ([place]) 04/20/2014  . H/O elevated lipids 08/11/2013  . Dizziness 06/09/2012  . Cerumen impaction 06/09/2012  . Nasal polyposis 05/18/2012  . Thyroiditis 07/04/2011  . PSVT 08/21/2010  . PALPITATIONS 05/04/2009   Ruben Im, PT 10/16/20 3:20 PM Phone: [phone] Fax: [phone] Alvera Singh 10/16/2020, 3:20 PM  [facility] [address] [place], [place], [address] Phone: [phone]   Fax:  [phone]  Name: HELIX LAFONTAINE MRN: [id number] Date of Birth: [birth year]

## 2020-10-23 ENCOUNTER — Encounter: Payer: Medicare HMO | Admitting: Physical Therapy

## 2020-11-01 ENCOUNTER — Encounter: Payer: Medicare HMO | Admitting: Physical Therapy

## 2020-11-07 DIAGNOSIS — G4733 Obstructive sleep apnea (adult) (pediatric): Secondary | ICD-10-CM | POA: Diagnosis not present

## 2020-11-12 DIAGNOSIS — E069 Thyroiditis, unspecified: Secondary | ICD-10-CM | POA: Diagnosis not present

## 2020-11-12 DIAGNOSIS — Z9989 Dependence on other enabling machines and devices: Secondary | ICD-10-CM | POA: Diagnosis not present

## 2020-11-12 DIAGNOSIS — Z9889 Other specified postprocedural states: Secondary | ICD-10-CM | POA: Diagnosis not present

## 2020-11-12 DIAGNOSIS — I480 Paroxysmal atrial fibrillation: Secondary | ICD-10-CM | POA: Diagnosis not present

## 2020-11-12 DIAGNOSIS — G4733 Obstructive sleep apnea (adult) (pediatric): Secondary | ICD-10-CM | POA: Diagnosis not present

## 2020-11-12 DIAGNOSIS — I471 Supraventricular tachycardia: Secondary | ICD-10-CM | POA: Diagnosis not present

## 2020-11-14 DIAGNOSIS — G4733 Obstructive sleep apnea (adult) (pediatric): Secondary | ICD-10-CM | POA: Diagnosis not present

## 2020-11-14 DIAGNOSIS — Z9989 Dependence on other enabling machines and devices: Secondary | ICD-10-CM | POA: Diagnosis not present

## 2020-11-14 DIAGNOSIS — I480 Paroxysmal atrial fibrillation: Secondary | ICD-10-CM | POA: Diagnosis not present

## 2020-11-20 ENCOUNTER — Telehealth: Payer: Self-pay | Admitting: Physical Therapy

## 2020-11-20 NOTE — Telephone Encounter (Signed)
No encounter.  Patient requested to be put on hold 4 weeks ago.  If no follow up by the end of March will discharge.

## 2020-11-27 DIAGNOSIS — Z8709 Personal history of other diseases of the respiratory system: Secondary | ICD-10-CM | POA: Diagnosis not present

## 2020-11-27 DIAGNOSIS — Z9989 Dependence on other enabling machines and devices: Secondary | ICD-10-CM | POA: Diagnosis not present

## 2020-11-27 DIAGNOSIS — J343 Hypertrophy of nasal turbinates: Secondary | ICD-10-CM | POA: Diagnosis not present

## 2020-11-27 DIAGNOSIS — J342 Deviated nasal septum: Secondary | ICD-10-CM | POA: Diagnosis not present

## 2020-11-27 DIAGNOSIS — G4733 Obstructive sleep apnea (adult) (pediatric): Secondary | ICD-10-CM | POA: Diagnosis not present

## 2020-12-08 DIAGNOSIS — G4733 Obstructive sleep apnea (adult) (pediatric): Secondary | ICD-10-CM | POA: Diagnosis not present

## 2021-01-07 DIAGNOSIS — G4733 Obstructive sleep apnea (adult) (pediatric): Secondary | ICD-10-CM | POA: Diagnosis not present

## 2021-02-19 DIAGNOSIS — G4733 Obstructive sleep apnea (adult) (pediatric): Secondary | ICD-10-CM | POA: Diagnosis not present

## 2021-05-06 DIAGNOSIS — M545 Low back pain, unspecified: Secondary | ICD-10-CM | POA: Diagnosis not present

## 2021-05-06 DIAGNOSIS — M5136 Other intervertebral disc degeneration, lumbar region: Secondary | ICD-10-CM | POA: Diagnosis not present

## 2021-05-06 DIAGNOSIS — M419 Scoliosis, unspecified: Secondary | ICD-10-CM | POA: Diagnosis not present

## 2021-05-21 ENCOUNTER — Encounter: Payer: Self-pay | Admitting: Gastroenterology

## 2021-06-02 ENCOUNTER — Telehealth: Payer: Self-pay

## 2021-06-02 NOTE — Telephone Encounter (Signed)
Called and lvm to schedule AWV with PCP. Sw, cma

## 2021-06-04 DIAGNOSIS — G4733 Obstructive sleep apnea (adult) (pediatric): Secondary | ICD-10-CM | POA: Diagnosis not present

## 2021-06-09 DIAGNOSIS — M5136 Other intervertebral disc degeneration, lumbar region: Secondary | ICD-10-CM | POA: Diagnosis not present

## 2021-06-10 ENCOUNTER — Ambulatory Visit (INDEPENDENT_AMBULATORY_CARE_PROVIDER_SITE_OTHER): Payer: Medicare HMO | Admitting: Family Medicine

## 2021-06-10 ENCOUNTER — Other Ambulatory Visit: Payer: Self-pay

## 2021-06-10 VITALS — BP 110/64 | HR 72 | Temp 98.4°F | Wt 156.5 lb

## 2021-06-10 DIAGNOSIS — G2581 Restless legs syndrome: Secondary | ICD-10-CM

## 2021-06-10 DIAGNOSIS — M67449 Ganglion, unspecified hand: Secondary | ICD-10-CM

## 2021-06-10 NOTE — Progress Notes (Signed)
Established Patient Office Visit  Subjective:  Patient ID: Eric Kaufman, male    DOB: 1951/01/22  Age: 70 y.o. MRN: [id number]  CC:  Chief Complaint  Patient presents with   Blister    Small sores on the fingers of both hands, ongoing issues, sores have a stinging pain    HPI Eric Kaufman presents for what he describes as painless translucent cyst on several fingers including right middle finger, left index finger, and left middle finger.  These are all located dorsally between the DIP and nail matrix region.  He does have some arthritis involving several fingers.  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  He simply want to know what these were.  He has no eye problems from these otherwise.  He has history of restless leg symptoms but does not require medication regularly.  He has taken Mirapex periodically which helps.  No history of anemia.  Past Medical History:  Diagnosis Date   Anxiety    Palpitations    Papilledema    PSVT    Sleep apnea    mild uses cpap    Past Surgical History:  Procedure Laterality Date   COLONOSCOPY     heart ablation N/A 02/16/2015   NASAL SINUS SURGERY  04/03/2015   POLYPECTOMY      Family History  Problem Relation Age of Onset   Lung cancer Father    Cancer Father        lung   Cancer Mother        colon   Hypertension Mother    Colon cancer Mother    Colon polyps Sister    Colon cancer Maternal Uncle    Esophageal cancer Neg Hx    Rectal cancer Neg Hx    Stomach cancer Neg Hx     Social History   Socioeconomic History   Marital status: Married    Spouse name: Not on file   Number of children: Not on file   Years of education: Not on file   Highest education level: Not on file  Occupational History   Occupation: Artist    Employer: SELF EMPLOYED  Tobacco Use   Smoking status: Never   Smokeless tobacco: Never   Substance and Sexual Activity   Alcohol use: Yes    Comment: rarely   Drug use: No   Sexual activity: Not on file  Other Topics Concern   Not on file  Social History Narrative   Not on file   Social Determinants of Health   Financial Resource Strain: Not on file  Food Insecurity: Not on file  Transportation Needs: Not on file  Physical Activity: Not on file  Stress: Not on file  Social Connections: Not on file  Intimate Partner Violence: Not on file    Outpatient Medications Prior to Visit  Medication Sig Dispense Refill   atenolol (TENORMIN) 50 MG tablet Take 50 mg by mouth as needed (palpatations).     pramipexole (MIRAPEX) 0.125 MG tablet Take 1-2 po at night as needed for restless leg symptoms. 60 tablet 5   Facility-Administered Medications Prior to Visit  Medication Dose Route Frequency Provider Last Rate Last Admin   0.9 %  sodium chloride infusion  500 mL Intravenous Continuous Ladene Artist, MD        Allergies  Allergen Reactions   Aspirin Swelling   Penicillins  Per pt: unknown    ROS Review of Systems  Constitutional:  Negative for chills and fever.  Psychiatric/Behavioral:  Negative for sleep disturbance.      Objective:    Physical Exam Vitals reviewed.  Constitutional:      Appearance: Normal appearance.  Cardiovascular:     Rate and Rhythm: Normal rate and regular rhythm.  Skin:    Comments: He has small translucent cystic lesions which are very superficial involving the right middle finger and left index and middle fingers.  These are all about 3 mm in diameter.  Nontender.  Neurological:     Mental Status: He is alert.    BP 110/64 (BP Location: Left Arm, Patient Position: Standing, Cuff Size: Normal)   Pulse 72   Temp 98.4 F (36.9 C) (Oral)   Wt 156 lb 8 oz (71 kg)   SpO2 97%   BMI 21.83 kg/m  Wt Readings from Last 3 Encounters:  06/10/21 156 lb 8 oz (71 kg)  06/01/20 155 lb 4.8 oz (70.4 kg)  05/01/20 156 lb 4.8 oz (70.9  kg)     Health Maintenance Due  Topic Date Due   COVID-19 Vaccine (1) Never done   Hepatitis C Screening  Never done   Zoster Vaccines- Shingrix (1 of 2) Never done   TETANUS/TDAP  09/08/2020   INFLUENZA VACCINE  Never done   COLONOSCOPY (Pts 45-1yrs Insurance coverage will need to be confirmed)  05/14/2021    There are no preventive care reminders to display for this patient.  Lab Results  Component Value Date   TSH 1.17 11/30/2019   Lab Results  Component Value Date   WBC 12.1 (H) 01/10/2016   HGB 16.8 01/10/2016   HCT 51.1 01/10/2016   MCV 94.1 01/10/2016   PLT 324 01/10/2016   Lab Results  Component Value Date   NA 139 11/30/2019   K 4.0 11/30/2019   CO2 32 11/30/2019   GLUCOSE 70 11/30/2019   BUN 15 11/30/2019   CREATININE 0.76 11/30/2019   BILITOT 0.9 08/18/2013   ALKPHOS 66 08/18/2013   AST 18 08/18/2013   ALT 20 08/18/2013   PROT 7.4 08/18/2013   ALBUMIN 4.2 08/18/2013   CALCIUM 9.1 11/30/2019   ANIONGAP 12 01/10/2016   GFR 102.10 11/30/2019   Lab Results  Component Value Date   CHOL 170 12/23/2013   Lab Results  Component Value Date   HDL 53 12/23/2013   Lab Results  Component Value Date   LDLCALC 96 12/23/2013   Lab Results  Component Value Date   TRIG 103 12/23/2013   Lab Results  Component Value Date   CHOLHDL 3 08/18/2013   No results found for: HGBA1C    Assessment & Plan:   Problem List Items Addressed This Visit       Unprioritized   Restless legs   Other Visit Diagnoses     Digital mucous cyst of finger    -  Primary     Has benign appearing digital mucous cyst involving several digits as above.  We explained that these sometimes can be seen in relation to osteoarthritis.  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  We explained these are benign.  Reassurance given  Continue as needed use of Mirapex for his restless leg  symptoms.    Follow-up: No follow-ups on file.    Carolann Littler, MD

## 2021-06-10 NOTE — Patient Instructions (Signed)
You have digital mucoid cysts on the fingers and these are benign and would observe for now.

## 2021-06-20 ENCOUNTER — Telehealth: Payer: Self-pay

## 2021-06-20 NOTE — Telephone Encounter (Signed)
No noted AWV in last year. Last OV was acute visit with PCP 06/10/21; multiple acute visits noted this past year. Pt last OV to establish care was 11/30/19. Next scheduled acute visit appt on 06/24/21  LVM instructions for pt to call back to schedule preventative exam/AWV or notify front desk staff of date upon arrival for appt on 06/24/21. Will send mychart message as well.

## 2021-06-24 ENCOUNTER — Other Ambulatory Visit: Payer: Self-pay

## 2021-06-24 ENCOUNTER — Ambulatory Visit (INDEPENDENT_AMBULATORY_CARE_PROVIDER_SITE_OTHER): Payer: Medicare HMO | Admitting: Family Medicine

## 2021-06-24 VITALS — BP 130/78 | HR 72 | Temp 98.1°F | Ht 71.0 in | Wt 152.3 lb

## 2021-06-24 DIAGNOSIS — M545 Low back pain, unspecified: Secondary | ICD-10-CM

## 2021-06-24 MED ORDER — CELECOXIB 200 MG PO CAPS: 200.0000 mg | ORAL_CAPSULE | Freq: Every day | ORAL | 1 refills | Status: DC
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
## 2021-07-04 DIAGNOSIS — G4733 Obstructive sleep apnea (adult) (pediatric): Secondary | ICD-10-CM | POA: Diagnosis not present

## 2021-07-22 ENCOUNTER — Encounter: Payer: Self-pay | Admitting: Gastroenterology

## 2021-08-02 DIAGNOSIS — Z20822 Contact with and (suspected) exposure to covid-19: Secondary | ICD-10-CM | POA: Diagnosis not present

## 2021-08-02 DIAGNOSIS — J101 Influenza due to other identified influenza virus with other respiratory manifestations: Secondary | ICD-10-CM | POA: Diagnosis not present

## 2021-08-02 DIAGNOSIS — R5383 Other fatigue: Secondary | ICD-10-CM | POA: Diagnosis not present

## 2021-08-04 DIAGNOSIS — G4733 Obstructive sleep apnea (adult) (pediatric): Secondary | ICD-10-CM | POA: Diagnosis not present

## 2021-08-14 DIAGNOSIS — I491 Atrial premature depolarization: Secondary | ICD-10-CM | POA: Diagnosis not present

## 2021-08-14 DIAGNOSIS — I471 Supraventricular tachycardia: Secondary | ICD-10-CM | POA: Diagnosis not present

## 2021-08-14 DIAGNOSIS — I480 Paroxysmal atrial fibrillation: Secondary | ICD-10-CM | POA: Diagnosis not present

## 2021-08-14 DIAGNOSIS — I444 Left anterior fascicular block: Secondary | ICD-10-CM | POA: Diagnosis not present

## 2021-08-16 DIAGNOSIS — M419 Scoliosis, unspecified: Secondary | ICD-10-CM | POA: Diagnosis not present

## 2021-08-16 DIAGNOSIS — M5459 Other low back pain: Secondary | ICD-10-CM | POA: Diagnosis not present

## 2021-08-16 DIAGNOSIS — M5136 Other intervertebral disc degeneration, lumbar region: Secondary | ICD-10-CM | POA: Diagnosis not present

## 2021-09-10 DIAGNOSIS — G4733 Obstructive sleep apnea (adult) (pediatric): Secondary | ICD-10-CM | POA: Diagnosis not present

## 2021-09-26 ENCOUNTER — Encounter: Payer: Medicare HMO | Admitting: Gastroenterology

## 2021-09-27 DIAGNOSIS — M5136 Other intervertebral disc degeneration, lumbar region: Secondary | ICD-10-CM | POA: Diagnosis not present

## 2021-10-10 DIAGNOSIS — M545 Low back pain, unspecified: Secondary | ICD-10-CM | POA: Diagnosis not present

## 2021-10-11 DIAGNOSIS — G4733 Obstructive sleep apnea (adult) (pediatric): Secondary | ICD-10-CM | POA: Diagnosis not present

## 2021-10-30 ENCOUNTER — Encounter: Payer: Self-pay | Admitting: Family Medicine

## 2021-10-30 MED ORDER — PRAMIPEXOLE DIHYDROCHLORIDE 0.125 MG PO TABS: ORAL_TABLET | ORAL | 5 refills | Status: DC

## 2021-11-01 ENCOUNTER — Other Ambulatory Visit: Payer: Self-pay | Admitting: Family Medicine

## 2021-11-08 DIAGNOSIS — G4733 Obstructive sleep apnea (adult) (pediatric): Secondary | ICD-10-CM | POA: Diagnosis not present

## 2021-11-14 DIAGNOSIS — Z9989 Dependence on other enabling machines and devices: Secondary | ICD-10-CM | POA: Diagnosis not present

## 2021-11-14 DIAGNOSIS — I480 Paroxysmal atrial fibrillation: Secondary | ICD-10-CM | POA: Diagnosis not present

## 2021-11-14 DIAGNOSIS — G4733 Obstructive sleep apnea (adult) (pediatric): Secondary | ICD-10-CM | POA: Diagnosis not present

## 2021-11-29 ENCOUNTER — Telehealth: Payer: Self-pay

## 2021-11-29 NOTE — Telephone Encounter (Signed)
Pt notified that time for preventative exam. Pt declines at this time. ?

## 2021-12-03 DIAGNOSIS — M5136 Other intervertebral disc degeneration, lumbar region: Secondary | ICD-10-CM | POA: Diagnosis not present

## 2021-12-25 ENCOUNTER — Encounter: Payer: Self-pay | Admitting: Gastroenterology

## 2022-01-14 ENCOUNTER — Telehealth: Payer: Self-pay | Admitting: Family Medicine

## 2022-01-14 ENCOUNTER — Ambulatory Visit (INDEPENDENT_AMBULATORY_CARE_PROVIDER_SITE_OTHER): Payer: Medicare HMO | Admitting: Family Medicine

## 2022-01-14 ENCOUNTER — Encounter: Payer: Self-pay | Admitting: Family Medicine

## 2022-01-14 VITALS — BP 126/68 | HR 82 | Temp 97.9°F | Ht 71.0 in | Wt 154.1 lb

## 2022-01-14 DIAGNOSIS — R21 Rash and other nonspecific skin eruption: Secondary | ICD-10-CM

## 2022-01-14 MED ORDER — IVERMECTIN 3 MG PO TABS: 150.0000 ug/kg | ORAL_TABLET | Freq: Once | ORAL | 0 refills | Status: AC

## 2022-01-14 MED ORDER — PERMETHRIN 5 % EX CREA: 1.0000 "application " | TOPICAL_CREAM | Freq: Once | CUTANEOUS | 0 refills | Status: AC

## 2022-01-14 NOTE — Progress Notes (Signed)
? ?  Established Patient Office Visit ? ?Subjective   ?Patient ID: Eric Kaufman, male    DOB: 11/08/1950  Age: 71 y.o. MRN: [id number] ? ?Chief Complaint  ?Patient presents with  ? Follow-up  ? ? ?HPI ? ? ?Seen with pruritic rash which started recently when he was at the beach.  He does have past history of probable scabies and states this rash is very similar.  He has fairly intense pruritus.  He has several slightly raised areas including upper extremities, waist, trunk.  Had similar involvement years ago and was tried on multiple topicals before finally being treated for scabies and symptoms resolved.  At that time, he took combination of topical permethrin and ivermectin.  His wife currently does not have any rash. ? ?Past Medical History:  ?Diagnosis Date  ? Anxiety   ? Palpitations   ? Papilledema   ? PSVT   ? Sleep apnea   ? mild uses cpap  ? ?Past Surgical History:  ?Procedure Laterality Date  ? COLONOSCOPY    ? heart ablation N/A 02/16/2015  ? NASAL SINUS SURGERY  04/03/2015  ? POLYPECTOMY    ? ? reports that he has never smoked. He has never used smokeless tobacco. He reports current alcohol use. He reports that he does not use drugs. ?family history includes Cancer in his father and mother; Colon cancer in his maternal uncle and mother; Colon polyps in his sister; Hypertension in his mother; Lung cancer in his father. ?Allergies  ?Allergen Reactions  ? Aspirin Swelling  ? Penicillins   ?  Per pt: unknown  ? ? ?Review of Systems  ?Constitutional:  Negative for chills and fever.  ?Skin:  Positive for rash.  ? ?  ?Objective:  ?  ? ?BP 126/68 (BP Location: Left Arm, Patient Position: Sitting, Cuff Size: Normal)   Pulse 82   Temp 97.9 ?F (36.6 ?C) (Oral)   Ht '5\' 11"'$  (1.803 m)   Wt 154 lb 1.6 oz (69.9 kg)   SpO2 98%   BMI 21.49 kg/m?  ? ? ?Physical Exam ?Vitals reviewed.  ?Constitutional:   ?   Appearance: Normal appearance.  ?Cardiovascular:  ?   Rate and Rhythm: Normal rate and regular rhythm.  ?Skin: ?    Comments: Scattered area of rash involving his trunk and upper extremities.  He has some nonspecific erythematous raised areas in a few with excoriated surface.  No vesicles.  No pustules.  Some of these have slightly linear arrangement  ?Neurological:  ?   Mental Status: He is alert.  ? ? ? ?No results found for any visits on 01/14/22. ? ? ? ?The ASCVD Risk score (Arnett DK, et al., 2019) failed to calculate for the following reasons: ?  Cannot find a previous HDL lab ?  Cannot find a previous total cholesterol lab ? ?  ?Assessment & Plan:  ? ?Pruritic skin rash.  Possible scabies.  Patient has had scabies in the past and rash look very similar and felt very similar. ? ?-We discussed complexities and difficulties with laboratory testing for scabies for example from scrapings. ?-Decided to go ahead and treat empirically with Elimite cream and consider retreat in 2 weeks if necessary. ?-He previously did well with combination therapy with oral ivermectin and that prescription was sent as well.  He knows importance of cleaning bed linens and sleep clothing after treatment ?Follow-up for persistent rash or other concerns ? ?No follow-ups on file.  ? ? ?Carolann Littler, MD ? ?

## 2022-01-14 NOTE — Patient Instructions (Signed)
Let me know in one to two weeks if not clearing.  ?

## 2022-01-15 DIAGNOSIS — G4733 Obstructive sleep apnea (adult) (pediatric): Secondary | ICD-10-CM | POA: Diagnosis not present

## 2022-02-05 ENCOUNTER — Ambulatory Visit (AMBULATORY_SURGERY_CENTER): Payer: Medicare HMO | Admitting: *Deleted

## 2022-02-05 VITALS — Ht 71.0 in | Wt 154.0 lb

## 2022-02-05 DIAGNOSIS — Z86010 Personal history of colonic polyps: Secondary | ICD-10-CM

## 2022-02-05 DIAGNOSIS — Z800 Family history of malignant neoplasm of digestive organs: Secondary | ICD-10-CM

## 2022-02-05 MED ORDER — NA SULFATE-K SULFATE-MG SULF 17.5-3.13-1.6 GM/177ML PO SOLN: 1.0000 | ORAL | 0 refills | Status: DC

## 2022-02-05 NOTE — Progress Notes (Signed)
Patient's pre-visit was done today over the phone with the patient. Name,DOB and address verified. Patient denies any allergies to Eggs and Soy. Patient denies any problems with anesthesia/sedation. Patient is not taking any diet pills or blood thinners. No home Oxygen. Insurance confirmed with patient.  Prep instructions sent to pt's MyChart & mailed to pt-pt is aware. Patient understands to call us back with any questions or concerns. Patient is aware of our care-partner policy. Pt will use good rx w/Suprep rx.  EMMI education assigned to the patient for the procedure, sent to [place].

## 2022-02-19 DIAGNOSIS — M545 Low back pain, unspecified: Secondary | ICD-10-CM | POA: Diagnosis not present

## 2022-02-26 NOTE — Telephone Encounter (Signed)
Error

## 2022-02-28 ENCOUNTER — Encounter: Payer: Self-pay | Admitting: Gastroenterology

## 2022-03-03 DIAGNOSIS — G4733 Obstructive sleep apnea (adult) (pediatric): Secondary | ICD-10-CM | POA: Diagnosis not present

## 2022-03-05 ENCOUNTER — Encounter: Payer: Self-pay | Admitting: Gastroenterology

## 2022-03-05 ENCOUNTER — Ambulatory Visit (AMBULATORY_SURGERY_CENTER): Payer: Medicare HMO | Admitting: Gastroenterology

## 2022-03-05 VITALS — BP 106/60 | HR 60 | Temp 97.7°F | Resp 14 | Ht 71.0 in | Wt 154.0 lb

## 2022-03-05 DIAGNOSIS — D124 Benign neoplasm of descending colon: Secondary | ICD-10-CM

## 2022-03-05 DIAGNOSIS — D123 Benign neoplasm of transverse colon: Secondary | ICD-10-CM

## 2022-03-05 DIAGNOSIS — Z800 Family history of malignant neoplasm of digestive organs: Secondary | ICD-10-CM | POA: Diagnosis not present

## 2022-03-05 DIAGNOSIS — Z86010 Personal history of colonic polyps: Secondary | ICD-10-CM | POA: Diagnosis not present

## 2022-03-05 DIAGNOSIS — Z09 Encounter for follow-up examination after completed treatment for conditions other than malignant neoplasm: Secondary | ICD-10-CM

## 2022-03-05 MED ORDER — SODIUM CHLORIDE 0.9 % IV SOLN: 500.0000 mL | Freq: Once | INTRAVENOUS | Status: DC

## 2022-03-05 NOTE — Patient Instructions (Signed)
YOU HAD AN ENDOSCOPIC PROCEDURE TODAY AT THE [facility]:   Refer to the procedure report that was given to you for any specific questions about what was found during the examination.  If the procedure report does not answer your questions, please call your gastroenterologist to clarify.  If you requested that your care partner not be given the details of your procedure findings, then the procedure report has been included in a sealed envelope for you to review at your convenience later.  YOU SHOULD EXPECT: Some feelings of bloating in the abdomen. Passage of more gas than usual.  Walking can help get rid of the air that was put into your GI tract during the procedure and reduce the bloating. If you had a lower endoscopy (such as a colonoscopy or flexible sigmoidoscopy) you may notice spotting of blood in your stool or on the toilet paper. If you underwent a bowel prep for your procedure, you may not have a normal bowel movement for a few days.  Please Note:  You might notice some irritation and congestion in your nose or some drainage.  This is from the oxygen used during your procedure.  There is no need for concern and it should clear up in a day or so.  SYMPTOMS TO REPORT IMMEDIATELY:  Following lower endoscopy (colonoscopy or flexible sigmoidoscopy):  Excessive amounts of blood in the stool  Significant tenderness or worsening of abdominal pains  Swelling of the abdomen that is new, acute  Fever of 100F or higher  Following upper endoscopy (EGD)  Vomiting of blood or coffee ground material  New chest pain or pain under the shoulder blades  Painful or persistently difficult swallowing  New shortness of breath  Fever of 100F or higher  Black, tarry-looking stools  For urgent or emergent issues, a gastroenterologist can be reached at any hour by calling [phone]. Do not use MyChart messaging for urgent concerns.    DIET:  We do recommend a small meal at first, but  then you may proceed to your regular diet.  Drink plenty of fluids but you should avoid alcoholic beverages for 24 hours.  ACTIVITY:  You should plan to take it easy for the rest of today and you should NOT DRIVE or use heavy machinery until tomorrow (because of the sedation medicines used during the test).    FOLLOW UP: Our staff will call the number listed on your records the next business day following your procedure.  We will call around 7:15- 8:00 am to check on you and address any questions or concerns that you may have regarding the information given to you following your procedure. If we do not reach you, we will leave a message.  If you develop any symptoms (ie: fever, flu-like symptoms, shortness of breath, cough etc.) before then, please call [phone].  If you test positive for Covid 19 in the 2 weeks post procedure, please call and report this information to us.    If any biopsies were taken you will be contacted by phone or by letter within the next 1-3 weeks.  Please call us at [phone] if you have not heard about the biopsies in 3 weeks.    SIGNATURES/CONFIDENTIALITY: You and/or your care partner have signed paperwork which will be entered into your electronic medical record.  These signatures attest to the fact that that the information above on your After Visit Summary has been reviewed and is understood.  Full responsibility of the confidentiality   of this discharge information lies with you and/or your care-partner.  

## 2022-03-05 NOTE — Progress Notes (Signed)
Called to room to assist during endoscopic procedure.  Patient ID and intended procedure confirmed with present staff. Received instructions for my participation in the procedure from the performing physician.  

## 2022-03-05 NOTE — Progress Notes (Signed)
Report to pacu rn; vss. Care resumed by rn. 

## 2022-03-05 NOTE — Op Note (Addendum)
[facility] Patient Name: Eric Kaufman Procedure Date: 03/05/2022 8:11 AM MRN: [id number] Endoscopist: Ladene Artist , MD Age: 71 Referring MD:  Date of Birth: 1951/01/31 Gender: Male Account #: [id number] Procedure:                Colonoscopy Indications:              High risk colon cancer surveillance: Personal                            history of sessile serrated colon polyp (less than                            10 mm in size) with no dysplasia. Family history of                            colon cancer, 1st-degree relative. Medicines:                Monitored Anesthesia Care Procedure:                Pre-Anesthesia Assessment:                           - Prior to the procedure, a History and Physical                            was performed, and patient medications and                            allergies were reviewed. The patient's tolerance of                            previous anesthesia was also reviewed. The risks                            and benefits of the procedure and the sedation                            options and risks were discussed with the patient.                            All questions were answered, and informed consent                            was obtained. Prior Anticoagulants: The patient has                            taken no previous anticoagulant or antiplatelet                            agents. ASA Grade Assessment: II - A patient with                            mild systemic disease. After reviewing the risks  and benefits, the patient was deemed in                            satisfactory condition to undergo the procedure.                           After obtaining informed consent, the colonoscope                            was passed under direct vision. Throughout the                            procedure, the patient's blood pressure, pulse, and                            oxygen saturations were  monitored continuously. The                            Olympus PCF-H190DL ([id number]) Colonoscope was                            introduced through the anus and advanced to the the                            cecum, identified by appendiceal orifice and                            ileocecal valve. The ileocecal valve, appendiceal                            orifice, and rectum were photographed. The quality                            of the bowel preparation was good. The colonoscopy                            was performed without difficulty. The patient                            tolerated the procedure well. Scope In: 8:27:05 AM Scope Out: 8:45:20 AM Scope Withdrawal Time: 0 hours 14 minutes 2 seconds  Total Procedure Duration: 0 hours 18 minutes 15 seconds  Findings:                 The perianal and digital rectal examinations were                            normal.                           Two sessile polyps were found in the descending                            colon and transverse colon. The polyps were 6 to 7  mm in size. These polyps were removed with a cold                            snare. Resection was complete and retrieval of the                            transverse polyp was complete.                           The exam was otherwise without abnormality on                            direct and retroflexion views. Complications:            No immediate complications. Estimated blood loss:                            None. Estimated Blood Loss:     Estimated blood loss: none. Impression:               - Two 6 to 7 mm polyps in the descending colon and                            in the transverse colon, removed with a cold snare.                            Resected and one retrieved.                           - The examination was otherwise normal on direct                            and retroflexion views. Recommendation:           - Repeat  colonoscopy, likely 5 years, date to be                            determined after pending pathology results are                            reviewed for surveillance based on pathology                            results.                           - Patient has a contact number available for                            emergencies. The signs and symptoms of potential                            delayed complications were discussed with the                            patient. Return  to normal activities tomorrow.                            Written discharge instructions were provided to the                            patient.                           - Resume previous diet.                           - Continue present medications.                           - Await pathology results. Ladene Artist, MD 03/05/2022 8:48:41 AM This report has been signed electronically.

## 2022-03-05 NOTE — Progress Notes (Signed)
History & Physical  Primary Care Physician:  Eulas Post, MD Primary Gastroenterologist: Lucio Edward, MD  CHIEF COMPLAINT: Family history of colon cancer, Personal history of colon polyps   HPI: Eric Kaufman is a 71 y.o. male with a family history of colon cancer, first-degree relative, and personal history of sessile serrated polyp for colonoscopy.   Past Medical History:  Diagnosis Date   Anxiety    Palpitations    Papilledema    PSVT    Sleep apnea    mild uses cpap    Past Surgical History:  Procedure Laterality Date   COLONOSCOPY  2013   COLONOSCOPY WITH PROPOFOL  05/14/2016   Dr.Matie Dimaano   heart ablation N/A 02/16/2015   NASAL SINUS SURGERY  04/03/2015   POLYPECTOMY      Prior to Admission medications   Medication Sig Start Date End Date Taking? Authorizing Provider  atenolol (TENORMIN) 50 MG tablet Take 50 mg by mouth as needed (palpatations).    [provider]  diltiazem (CARDIZEM) 30 MG tablet     [provider]  pramipexole (MIRAPEX) 0.125 MG tablet TAKE 1-2 TABLETS BY MOUTH EVERY NIGHT AT BEDTIME AS NEEDED FOR RESTLESS LEG SYMPTOMS 11/01/21   [name], Alinda Sierras, MD    Current Outpatient Medications  Medication Sig Dispense Refill   atenolol (TENORMIN) 50 MG tablet Take 50 mg by mouth as needed (palpatations).     diltiazem (CARDIZEM) 30 MG tablet      pramipexole (MIRAPEX) 0.125 MG tablet TAKE 1-2 TABLETS BY MOUTH EVERY NIGHT AT BEDTIME AS NEEDED FOR RESTLESS LEG SYMPTOMS 60 tablet 5   Current Facility-Administered Medications  Medication Dose Route Frequency Provider Last Rate Last Admin   0.9 %  sodium chloride infusion  500 mL Intravenous Once Ladene Artist, MD        Allergies as of 03/05/2022 - Review Complete 03/05/2022  Allergen Reaction Noted   Aspirin Swelling    Penicillins      Family History  Problem Relation Age of Onset   Cancer Mother        colon   Hypertension Mother    Colon cancer Mother         mid 28's   Lung cancer Father    Cancer Father        lung   Colon polyps Sister    Colon cancer Maternal Uncle    Esophageal cancer Neg Hx    Rectal cancer Neg Hx    Stomach cancer Neg Hx     Social History   Socioeconomic History   Marital status: Married    Spouse name: Not on file   Number of children: Not on file   Years of education: Not on file   Highest education level: Not on file  Occupational History   Occupation: Artist    Employer: SELF EMPLOYED  Tobacco Use   Smoking status: Never   Smokeless tobacco: Never  Vaping Use   Vaping Use: Never used  Substance and Sexual Activity   Alcohol use: Not Currently    Comment: rarely   Drug use: No   Sexual activity: Not on file  Other Topics Concern   Not on file  Social History Narrative   Not on file   Social Determinants of Health   Financial Resource Strain: Not on file  Food Insecurity: Not on file  Transportation Needs: Not on file  Physical Activity: Not on file  Stress: Not on file  Social Connections: Not on file  Intimate Partner Violence: Not on file    Review of Systems:  All systems reviewed an negative except where noted in HPI.  Gen: Denies any fever, chills, sweats, anorexia, fatigue, weakness, malaise, weight loss, and sleep disorder CV: Denies chest pain, angina, palpitations, syncope, orthopnea, PND, peripheral edema, and claudication. Resp: Denies dyspnea at rest, dyspnea with exercise, cough, sputum, wheezing, coughing up blood, and pleurisy. GI: Denies vomiting blood, jaundice, and fecal incontinence.   Denies dysphagia or odynophagia. GU : Denies urinary burning, blood in urine, urinary frequency, urinary hesitancy, nocturnal urination, and urinary incontinence. MS: Denies joint pain, limitation of movement, and swelling, stiffness, low back pain, extremity pain. Denies muscle weakness, cramps, atrophy.  Derm: Denies rash, itching, dry skin, hives, moles, warts, or unhealing ulcers.   Psych: Denies depression, anxiety, memory loss, suicidal ideation, hallucinations, paranoia, and confusion. Heme: Denies bruising, bleeding, and enlarged lymph nodes. Neuro:  Denies any headaches, dizziness, paresthesias. Endo:  Denies any problems with DM, thyroid, adrenal function.   Physical Exam: General:  Alert, well-developed, in NAD Head:  Normocephalic and atraumatic. Eyes:  Sclera clear, no icterus.   Conjunctiva pink. Ears:  Normal auditory acuity. Mouth:  No deformity or lesions.  Neck:  Supple; no masses . Lungs:  Clear throughout to auscultation.   No wheezes, crackles, or rhonchi. No acute distress. Heart:  Regular rate and rhythm; no murmurs. Abdomen:  Soft, nondistended, nontender. No masses, hepatomegaly. No obvious masses.  Normal bowel .    Rectal:  Deferred   Msk:  Symmetrical without gross deformities.. Pulses:  Normal pulses noted. Extremities:  Without edema. Neurologic:  Alert and  oriented x4;  grossly normal neurologically. Skin:  Intact without significant lesions or rashes. Cervical Nodes:  No significant cervical adenopathy. Psych:  Alert and cooperative. Normal mood and affect.  Impression / Plan:   Family history of colon cancer, first-degree relative, and personal history of sessile serrated polyp for colonoscopy.  Eric Kaufman. Fuller Plan  03/05/2022, 8:15 AM See Shea Evans, [facility], to contact our on call provider

## 2022-03-05 NOTE — Progress Notes (Signed)
Pt's states no medical or surgical changes since previsit or office visit. 

## 2022-03-06 ENCOUNTER — Telehealth: Payer: Self-pay | Admitting: *Deleted

## 2022-03-06 NOTE — Telephone Encounter (Signed)
Spouse answered phone and stated patient is still asleep but is doing well. Patient is planning to call office today when he wakes up, as he has a question.

## 2022-03-16 ENCOUNTER — Encounter: Payer: Self-pay | Admitting: Gastroenterology

## 2022-03-24 DIAGNOSIS — M47816 Spondylosis without myelopathy or radiculopathy, lumbar region: Secondary | ICD-10-CM | POA: Diagnosis not present

## 2022-04-02 DIAGNOSIS — G4733 Obstructive sleep apnea (adult) (pediatric): Secondary | ICD-10-CM | POA: Diagnosis not present

## 2022-04-28 ENCOUNTER — Ambulatory Visit (INDEPENDENT_AMBULATORY_CARE_PROVIDER_SITE_OTHER): Payer: Medicare HMO | Admitting: Family Medicine

## 2022-04-28 ENCOUNTER — Encounter: Payer: Self-pay | Admitting: Family Medicine

## 2022-04-28 VITALS — BP 120/68 | HR 60 | Temp 97.9°F | Ht 71.0 in | Wt 156.2 lb

## 2022-04-28 DIAGNOSIS — I491 Atrial premature depolarization: Secondary | ICD-10-CM | POA: Diagnosis not present

## 2022-04-28 DIAGNOSIS — R002 Palpitations: Secondary | ICD-10-CM | POA: Diagnosis not present

## 2022-04-28 NOTE — Progress Notes (Signed)
Established Patient Office Visit  Subjective   Patient ID: Eric Kaufman, male    DOB: 03/03/1951  Age: 71 y.o. MRN: [id number]  Chief Complaint  Patient presents with   Palpitations    Patient complains of palpitations, x3 days, Patient denies any shortness of breath, Chest pain    HPI   Seen with 3-day history of palpitations.  He has longstanding history of arrhythmias.  Sounds like he has had both paroxysmal atrial fibrillation and paroxysmal supraventricular tachycardia in the past .    He states since around age 77 he had some intermittent tachyarrhythmias (presumably supraventricular tachycardia).  He was diagnosed with intermittent atrial fibrillation as an adult and in 2016 states he had ablation procedure.  No evidence for recurrent A-fib since then.  He does have atenolol 25 mg to 50 mg which he takes as needed for palpitations.  Symptoms started Friday.  He does recall drinking a couple beers and also had limited chocolate.  No recent chest pains.  No dyspnea.  No dizziness or syncope.  Sees cardiologist regularly.  Recent palpitations have been more sporadic and not prolonged.  He states this feels different than the A-fib he had previously.  Symptoms seem to be worse when lying on his right side and definitely seem to be worse at night and not noted with activity  Past Medical History:  Diagnosis Date   Anxiety    Palpitations    Papilledema    PSVT    Sleep apnea    mild uses cpap   Past Surgical History:  Procedure Laterality Date   COLONOSCOPY  2013   COLONOSCOPY WITH PROPOFOL  05/14/2016   Dr.Stark   heart ablation N/A 02/16/2015   NASAL SINUS SURGERY  04/03/2015   POLYPECTOMY      reports that he has never smoked. He has never used smokeless tobacco. He reports that he does not currently use alcohol. He reports that he does not use drugs. family history includes Cancer in his father and mother; Colon cancer in his maternal uncle and mother; Colon polyps in  his sister; Hypertension in his mother; Lung cancer in his father. Allergies  Allergen Reactions   Aspirin Swelling   Penicillins     Per pt: unknown    Review of Systems  Constitutional:  Negative for weight loss.  Respiratory:  Negative for cough and shortness of breath.   Cardiovascular:  Positive for palpitations. Negative for chest pain and leg swelling.  Neurological:  Negative for dizziness.      Objective:     BP 120/68 (BP Location: Left Arm, Patient Position: Sitting, Cuff Size: Normal)   Pulse 60   Temp 97.9 F (36.6 C) (Oral)   Ht '5\' 11"'$  (1.803 m)   Wt 156 lb 3.2 oz (70.9 kg)   SpO2 98%   BMI 21.79 kg/m  BP Readings from Last 3 Encounters:  04/28/22 120/68  03/05/22 106/60  01/14/22 126/68   Wt Readings from Last 3 Encounters:  04/28/22 156 lb 3.2 oz (70.9 kg)  03/05/22 154 lb (69.9 kg)  02/05/22 154 lb (69.9 kg)      Physical Exam Vitals reviewed.  Constitutional:      Appearance: Normal appearance. He is well-developed.  Eyes:     Pupils: Pupils are equal, round, and reactive to light.  Neck:     Thyroid: No thyromegaly.  Cardiovascular:     Rate and Rhythm: Normal rate and regular rhythm.     Heart  sounds: No murmur heard.    No gallop.  Pulmonary:     Effort: Pulmonary effort is normal. No respiratory distress.     Breath sounds: Normal breath sounds. No wheezing or rales.  Musculoskeletal:     Cervical back: Neck supple.  Neurological:     Mental Status: He is alert and oriented to person, place, and time.      No results found for any visits on 04/28/22.  Last CBC Lab Results  Component Value Date   WBC 12.1 (H) 01/10/2016   HGB 16.8 01/10/2016   HCT 51.1 01/10/2016   MCV 94.1 01/10/2016   MCH 30.9 01/10/2016   RDW 12.9 01/10/2016   PLT 324 19/62/2297   Last metabolic panel Lab Results  Component Value Date   GLUCOSE 70 11/30/2019   NA 139 11/30/2019   K 4.0 11/30/2019   CL 101 11/30/2019   CO2 32 11/30/2019   BUN  15 11/30/2019   CREATININE 0.76 11/30/2019   GFRNONAA >60 01/10/2016   CALCIUM 9.1 11/30/2019   PROT 7.4 08/18/2013   ALBUMIN 4.2 08/18/2013   BILITOT 0.9 08/18/2013   ALKPHOS 66 08/18/2013   AST 18 08/18/2013   ALT 20 08/18/2013   ANIONGAP 12 01/10/2016   Last thyroid functions Lab Results  Component Value Date   TSH 1.17 11/30/2019      The ASCVD Risk score (Arnett DK, et al., 2019) failed to calculate for the following reasons:   Cannot find a previous HDL lab   Cannot find a previous total cholesterol lab    Assessment & Plan:   Problem List Items Addressed This Visit       Unprioritized   PALPITATIONS - Primary  Patient has past history of atrial fibrillation.  Recent sporadic palpitations worse at night and suspect symptomatic PACs or PVCs.  He did have some recent alcohol and chocolate which may have triggered.  -Check EKG-this shows sinus rhythm with occasional PACs.  No acute ST-T changes. -Continue to limit alcohol, caffeine, chocolate -Continue atenolol 25 to 50 mg every 12 hours as needed -We had discussed possible Zio patch to further assess but since he had obvious PACs on EKG today and these have been noted previously with no worrisome new symptoms feel monitoring patch would not add much -We have recommended that he follow-up with his cardiologist if palpitations continue to be symptomatic after observing for a few more days  No follow-ups on file.    Carolann Littler, MD

## 2022-04-29 ENCOUNTER — Encounter: Payer: Self-pay | Admitting: Family Medicine

## 2022-04-30 ENCOUNTER — Ambulatory Visit (INDEPENDENT_AMBULATORY_CARE_PROVIDER_SITE_OTHER): Payer: Medicare HMO | Admitting: Family Medicine

## 2022-04-30 ENCOUNTER — Encounter: Payer: Self-pay | Admitting: Family Medicine

## 2022-04-30 VITALS — BP 108/60 | HR 55 | Temp 98.1°F | Ht 71.0 in | Wt 154.4 lb

## 2022-04-30 DIAGNOSIS — R002 Palpitations: Secondary | ICD-10-CM

## 2022-04-30 DIAGNOSIS — I491 Atrial premature depolarization: Secondary | ICD-10-CM

## 2022-04-30 NOTE — Telephone Encounter (Signed)
Noted  

## 2022-04-30 NOTE — Progress Notes (Signed)
Established Patient Office Visit  Subjective   Patient ID: Eric Kaufman, male    DOB: 1951-04-29  Age: 71 y.o. MRN: [id number]  Chief Complaint  Patient presents with   Palpitations    HPI   Eric Kaufman was seen just couple days ago with about 3-day history of increased palpitations.  As previously noted, history of paroxysmal atrial fibrillation and paroxysmal supraventricular tachycardia.  Diagnosed with intermittent atrial fibrillation 2016 and had ablation procedure.  No A-fib documented since then.  Has been taking 25 to 50 mg of atenolol for palpitations recently and was still fairly symptomatic up until last night although symptoms have improved today.  His recent symptoms have been worse at night.  He has follow-up with his EP specialist (Dr Adrian Prows) September 11.  He had sent a note earlier today requesting referral to local cardiologist here in [place].  No recent chest pains.  No recent syncope.  Symptoms actually improved with activity such as walking.  Past Medical History:  Diagnosis Date   Anxiety    Palpitations    Papilledema    PSVT    Sleep apnea    mild uses cpap   Past Surgical History:  Procedure Laterality Date   COLONOSCOPY  2013   COLONOSCOPY WITH PROPOFOL  05/14/2016   Dr.Stark   heart ablation N/A 02/16/2015   NASAL SINUS SURGERY  04/03/2015   POLYPECTOMY      reports that he has never smoked. He has never used smokeless tobacco. He reports that he does not currently use alcohol. He reports that he does not use drugs. family history includes Cancer in his father and mother; Colon cancer in his maternal uncle and mother; Colon polyps in his sister; Hypertension in his mother; Lung cancer in his father. Allergies  Allergen Reactions   Aspirin Swelling   Penicillins     Per pt: unknown    Review of Systems  Constitutional:  Negative for chills and fever.  Respiratory:  Negative for cough and shortness of breath.   Cardiovascular:   Positive for palpitations. Negative for chest pain, leg swelling and PND.      Objective:     BP 108/60 (BP Location: Left Arm, Patient Position: Sitting, Cuff Size: Normal)   Pulse (!) 55   Temp 98.1 F (36.7 C) (Oral)   Ht '5\' 11"'$  (1.803 m)   Wt 154 lb 6.4 oz (70 kg)   SpO2 99%   BMI 21.53 kg/m  BP Readings from Last 3 Encounters:  04/30/22 108/60  04/28/22 120/68  03/05/22 106/60   Wt Readings from Last 3 Encounters:  04/30/22 154 lb 6.4 oz (70 kg)  04/28/22 156 lb 3.2 oz (70.9 kg)  03/05/22 154 lb (69.9 kg)      Physical Exam Vitals reviewed.  Constitutional:      Appearance: Normal appearance.  Cardiovascular:     Rate and Rhythm: Normal rate and regular rhythm.     Heart sounds: No murmur heard. Pulmonary:     Effort: Pulmonary effort is normal.     Breath sounds: Normal breath sounds.  Neurological:     Mental Status: He is alert.      No results found for any visits on 04/30/22.    The ASCVD Risk score (Arnett DK, et al., 2019) failed to calculate for the following reasons:   Cannot find a previous HDL lab   Cannot find a previous total cholesterol lab    Assessment & Plan:  Intermittent palpitations.  Past history of paroxysmal supraventricular tachycardia and atrial fibrillation.  EKG 2 days ago showed PACs.  Presumably has had symptomatic PACs past few days.  Symptoms improved today.  -Continue to minimize caffeine, chocolate, alcohol -We did discuss setting up Zio patch monitor and he wishes to wait to see if he has any recurrent symptoms tonight first -Setting up referral to general cardiology per his request -Continue atenolol 25 to 50 mg as needed every 12 hours for palpitations  No follow-ups on file.    Carolann Littler, MD

## 2022-04-30 NOTE — Patient Instructions (Signed)
Let me know if you want me to order heart monitor.    I will place order to Dr Stanford Breed.

## 2022-05-03 DIAGNOSIS — G4733 Obstructive sleep apnea (adult) (pediatric): Secondary | ICD-10-CM | POA: Diagnosis not present

## 2022-05-14 NOTE — Progress Notes (Deleted)
Referring-Bruce [name], MD Reason for referral-palpitations  HPI: 71 year old male for evaluation of palpitations at request of Carolann Littler, MD.  Patient has a history of paroxysmal atrial fibrillation and SVT.  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  He apparently has only had episodes of atrial fibrillation in the setting of steroid use.  He has scheduled follow-up with Dr. Adrian Prows in [facility].  He also requested a local physician and cardiology asked to evaluate.  Current Outpatient Medications  Medication Sig Dispense Refill   atenolol (TENORMIN) 50 MG tablet Take 50 mg by mouth as needed (palpatations).     diltiazem (CARDIZEM) 30 MG tablet      pramipexole (MIRAPEX) 0.125 MG tablet TAKE 1-2 TABLETS BY MOUTH EVERY NIGHT AT BEDTIME AS NEEDED FOR RESTLESS LEG SYMPTOMS 60 tablet 5   No current facility-administered medications for this visit.    Allergies  Allergen Reactions   Aspirin Swelling   Penicillins     Per pt: unknown     Past Medical History:  Diagnosis Date   Anxiety    Palpitations    Papilledema    PSVT    Sleep apnea    mild uses cpap    Past Surgical History:  Procedure Laterality Date   COLONOSCOPY  2013   COLONOSCOPY WITH PROPOFOL  05/14/2016   Dr.Stark   heart ablation N/A 02/16/2015   NASAL SINUS SURGERY  04/03/2015   POLYPECTOMY      Social History   Socioeconomic History   Marital status: Married    Spouse name: Not on file   Number of children: Not on file   Years of education: Not on file   Highest education level: Not on file  Occupational History   Occupation: Artist    Employer: SELF EMPLOYED  Tobacco Use   Smoking status: Never   Smokeless tobacco: Never  Vaping Use   Vaping Use: Never used  Substance and Sexual Activity   Alcohol use: Not Currently    Comment: rarely   Drug use: No   Sexual activity: Not on file  Other Topics Concern   Not on file  Social History  Narrative   Not on file   Social Determinants of Health   Financial Resource Strain: Not on file  Food Insecurity: Not on file  Transportation Needs: Not on file  Physical Activity: Not on file  Stress: Not on file  Social Connections: Not on file  Intimate Partner Violence: Not on file    Family History  Problem Relation Age of Onset   Cancer Mother        colon   Hypertension Mother    Colon cancer Mother        mid 50's   Lung cancer Father    Cancer Father        lung   Colon polyps Sister    Colon cancer Maternal Uncle    Esophageal cancer Neg Hx    Rectal cancer Neg Hx    Stomach cancer Neg Hx     ROS: no fevers or chills, productive cough, hemoptysis, dysphasia, odynophagia, melena, hematochezia, dysuria, hematuria, rash, seizure activity, orthopnea, PND, pedal edema, claudication. Remaining systems are negative.  Physical Exam:   There were no vitals taken for this visit.  General:  Well developed/well nourished in NAD Skin warm/dry Patient not depressed No peripheral clubbing Back-normal HEENT-normal/normal eyelids Neck supple/normal carotid upstroke bilaterally; no bruits; no JVD; no thyromegaly  chest - CTA/ normal expansion CV - RRR/normal S1 and S2; no murmurs, rubs or gallops;  PMI nondisplaced Abdomen -NT/ND, no HSM, no mass, + bowel sounds, no bruit 2+ femoral pulses, no bruits Ext-no edema, chords, 2+ DP Neuro-grossly nonfocal  ECG -April 28, 2022-normal sinus rhythm with PACs, left anterior fascicular block, RV conduction delay.  Personally reviewed  A/P  1 palpitations-  2 history of supraventricular tachycardia-status post AVNRT ablation.  3 paroxysmal atrial fibrillation-Per outside records only occurred in the setting of of steroid use.  4 obstructive sleep apnea-  Kirk Ruths, MD

## 2022-05-26 ENCOUNTER — Ambulatory Visit: Payer: Medicare HMO | Admitting: Cardiology

## 2022-05-27 DIAGNOSIS — G4733 Obstructive sleep apnea (adult) (pediatric): Secondary | ICD-10-CM | POA: Diagnosis not present

## 2022-07-08 DIAGNOSIS — G4733 Obstructive sleep apnea (adult) (pediatric): Secondary | ICD-10-CM | POA: Diagnosis not present

## 2022-07-08 DIAGNOSIS — J324 Chronic pansinusitis: Secondary | ICD-10-CM | POA: Diagnosis not present

## 2022-07-08 DIAGNOSIS — J342 Deviated nasal septum: Secondary | ICD-10-CM | POA: Diagnosis not present

## 2022-07-21 ENCOUNTER — Ambulatory Visit (INDEPENDENT_AMBULATORY_CARE_PROVIDER_SITE_OTHER): Payer: Medicare HMO | Admitting: Family Medicine

## 2022-07-21 ENCOUNTER — Encounter: Payer: Self-pay | Admitting: Family Medicine

## 2022-07-21 VITALS — BP 120/62 | HR 69 | Temp 98.2°F | Ht 71.0 in | Wt 155.0 lb

## 2022-07-21 DIAGNOSIS — M545 Low back pain, unspecified: Secondary | ICD-10-CM | POA: Diagnosis not present

## 2022-07-21 MED ORDER — METHYLPREDNISOLONE ACETATE 80 MG/ML IJ SUSP
80.0000 mg | Freq: Once | INTRAMUSCULAR | Status: AC
  Administered 2022-07-21: 80 mg via INTRAMUSCULAR

## 2022-07-21 MED ORDER — KETOROLAC TROMETHAMINE 60 MG/2ML IM SOLN: 60.0000 mg | Freq: Once | INTRAMUSCULAR | Status: DC

## 2022-07-21 MED ORDER — METHOCARBAMOL 750 MG PO TABS: 750.0000 mg | ORAL_TABLET | Freq: Four times a day (QID) | ORAL | 0 refills | Status: DC | PRN

## 2022-07-21 NOTE — Patient Instructions (Signed)
Health Maintenance Due  Topic Date Due   Medicare Annual Wellness (AWV)  Never done   COVID-19 Vaccine (1) Never done   Hepatitis C Screening  Never done   Zoster Vaccines- Shingrix (1 of 2) Never done   Pneumonia Vaccine 47+ Years old (1 - PCV) Never done   TETANUS/TDAP  09/08/2020   INFLUENZA VACCINE  Never done      Row Labels 07/21/2022    2:03 PM 06/24/2021   11:27 AM 01/12/2016    9:10 AM  Depression screen PHQ 2/9   Section Header. No data exists in this row.     Decreased Interest   0 0 0  Down, Depressed, Hopeless   0 0 0  PHQ - 2 Score   0 0 0

## 2022-07-21 NOTE — Progress Notes (Addendum)
Established Patient Office Visit  Subjective   Patient ID: Eric Kaufman, male    DOB: 16-Sep-1950  Age: 71 y.o. MRN: [id number]  Chief Complaint  Patient presents with   Back Pain    HPI   Changes seen with 1 day history of low back pain mostly right lumbar area.  His wife just had right knee replacement about 10 days ago and he has been having to change her dressing and having to stoop over that.  However, he thinks using a leaf blower for several hours recently may have been a greater trigger.  He has had previous MRI scan of the back and has seen orthopedic specialist the back for facet injections.  Current pain is worse with back flexion.  No radiculitis symptoms.  His wife had some Robaxin which she took which may have helped slightly.  He specifically is requesting injections which she has had in the past including steroid and Toradol which seemed to help tremendously.  He recalls having this combination both at urgent care and through orthopedic office in the past.  Past Medical History:  Diagnosis Date   Anxiety    Palpitations    Papilledema    PSVT    Sleep apnea    mild uses cpap   Past Surgical History:  Procedure Laterality Date   COLONOSCOPY  2013   COLONOSCOPY WITH PROPOFOL  05/14/2016   Dr.Stark   heart ablation N/A 02/16/2015   NASAL SINUS SURGERY  04/03/2015   POLYPECTOMY      reports that he has never smoked. He has never used smokeless tobacco. He reports that he does not currently use alcohol. He reports that he does not use drugs. family history includes Cancer in his father and mother; Colon cancer in his maternal uncle and mother; Colon polyps in his sister; Hypertension in his mother; Lung cancer in his father. Allergies  Allergen Reactions   Aspirin Swelling   Penicillins     Per pt: unknown    Review of Systems  Constitutional:  Negative for chills and fever.  Genitourinary:  Negative for dysuria.  Musculoskeletal:  Positive for back pain.   Neurological:  Negative for weakness.      Objective:     BP 120/62   Pulse 69   Temp 98.2 F (36.8 C) (Oral)   Ht '5\' 11"'$  (1.803 m)   Wt 155 lb (70.3 kg)   SpO2 96%   BMI 21.62 kg/m    Physical Exam Vitals reviewed.  Constitutional:      Appearance: Normal appearance.  Cardiovascular:     Rate and Rhythm: Normal rate and regular rhythm.  Musculoskeletal:     Comments: No localized spinal tenderness.  Straight leg raises are negative bilaterally.  Neurological:     Mental Status: He is alert.     Comments: Full strength lower extremities.  He has 1+ reflexes ankle and knee bilaterally.      No results found for any visits on 07/21/22.    The ASCVD Risk score (Arnett DK, et al., 2019) failed to calculate for the following reasons:   Cannot find a previous HDL lab   Cannot find a previous total cholesterol lab    Assessment & Plan:   Problem List Items Addressed This Visit   None Visit Diagnoses     Acute low back pain, unspecified back pain laterality, unspecified whether sciatica present    -  Primary   Relevant Medications   methocarbamol (ROBAXIN)  750 MG tablet   methylPREDNISolone acetate (DEPO-MEDROL) injection 80 mg   ketorolac (TORADOL) injection 60 mg     Patient presents with acute lower lumbar back pain.  Suspect musculoskeletal. -Discussed conservative management with stretches and heat or ice for symptom relief -Agreed to send in Robaxin 750 mg 1 every 6-8 hours as needed for muscle spasm -Patient previously has benefited from combination of Toradol and Depo-Medrol.  We gave Toradol 60 mg IM and Depo-Medrol 80 mg IM. -Recommend consideration for trial of physical therapy if not improving in a couple weeks.  No follow-ups on file.    Carolann Littler, MD  Addendum;  patient changed his mind about the Toradol    did get the depomedrol.  Eulas Post MD [facility] at [facility]

## 2022-08-18 DIAGNOSIS — I471 Supraventricular tachycardia, unspecified: Secondary | ICD-10-CM | POA: Diagnosis not present

## 2022-08-18 DIAGNOSIS — I480 Paroxysmal atrial fibrillation: Secondary | ICD-10-CM | POA: Diagnosis not present

## 2022-08-18 DIAGNOSIS — Z9889 Other specified postprocedural states: Secondary | ICD-10-CM | POA: Diagnosis not present

## 2022-08-18 DIAGNOSIS — E069 Thyroiditis, unspecified: Secondary | ICD-10-CM | POA: Diagnosis not present

## 2022-08-18 DIAGNOSIS — G4733 Obstructive sleep apnea (adult) (pediatric): Secondary | ICD-10-CM | POA: Diagnosis not present

## 2022-08-19 DIAGNOSIS — I444 Left anterior fascicular block: Secondary | ICD-10-CM | POA: Diagnosis not present

## 2022-09-04 DIAGNOSIS — H43813 Vitreous degeneration, bilateral: Secondary | ICD-10-CM | POA: Diagnosis not present

## 2022-09-26 DIAGNOSIS — G4733 Obstructive sleep apnea (adult) (pediatric): Secondary | ICD-10-CM | POA: Diagnosis not present

## 2022-10-10 DIAGNOSIS — H43393 Other vitreous opacities, bilateral: Secondary | ICD-10-CM | POA: Diagnosis not present

## 2022-10-10 DIAGNOSIS — D3132 Benign neoplasm of left choroid: Secondary | ICD-10-CM | POA: Diagnosis not present

## 2022-10-10 DIAGNOSIS — H3589 Other specified retinal disorders: Secondary | ICD-10-CM | POA: Diagnosis not present

## 2022-10-10 DIAGNOSIS — H43813 Vitreous degeneration, bilateral: Secondary | ICD-10-CM | POA: Diagnosis not present

## 2022-10-10 DIAGNOSIS — H5340 Unspecified visual field defects: Secondary | ICD-10-CM | POA: Diagnosis not present

## 2022-10-29 DIAGNOSIS — G4733 Obstructive sleep apnea (adult) (pediatric): Secondary | ICD-10-CM | POA: Diagnosis not present

## 2022-10-30 DIAGNOSIS — I480 Paroxysmal atrial fibrillation: Secondary | ICD-10-CM | POA: Diagnosis not present

## 2022-10-30 DIAGNOSIS — G4733 Obstructive sleep apnea (adult) (pediatric): Secondary | ICD-10-CM | POA: Diagnosis not present

## 2022-11-27 DIAGNOSIS — G4733 Obstructive sleep apnea (adult) (pediatric): Secondary | ICD-10-CM | POA: Diagnosis not present

## 2022-12-28 DIAGNOSIS — G4733 Obstructive sleep apnea (adult) (pediatric): Secondary | ICD-10-CM | POA: Diagnosis not present

## 2023-01-05 ENCOUNTER — Ambulatory Visit (INDEPENDENT_AMBULATORY_CARE_PROVIDER_SITE_OTHER): Payer: Medicare HMO | Admitting: Internal Medicine

## 2023-01-05 ENCOUNTER — Encounter: Payer: Self-pay | Admitting: Internal Medicine

## 2023-01-05 VITALS — BP 102/68 | HR 71 | Temp 98.2°F | Wt 160.3 lb

## 2023-01-05 DIAGNOSIS — H9192 Unspecified hearing loss, left ear: Secondary | ICD-10-CM

## 2023-01-05 DIAGNOSIS — H6122 Impacted cerumen, left ear: Secondary | ICD-10-CM

## 2023-01-05 NOTE — Progress Notes (Signed)
     Established Patient Office Visit     CC/Reason for Visit: Decreased hearing, itching of left ear  HPI: Eric Kaufman is a 72 y.o. male who is coming in today for the above mentioned reasons.  He believes he has impacted cerumen.  This has happened in the past.  For the past few weeks has noticed decreased hearing.   Past Medical/Surgical History: Past Medical History:  Diagnosis Date   Anxiety    Palpitations    Papilledema    PSVT    Sleep apnea    mild uses cpap    Past Surgical History:  Procedure Laterality Date   COLONOSCOPY  2013   COLONOSCOPY WITH PROPOFOL  05/14/2016   Dr.Stark   heart ablation N/A 02/16/2015   NASAL SINUS SURGERY  04/03/2015   POLYPECTOMY      Social History:  reports that he has never smoked. He has never used smokeless tobacco. He reports that he does not currently use alcohol. He reports that he does not use drugs.  Allergies: Allergies  Allergen Reactions   Aspirin Swelling   Penicillins     Per pt: unknown    Family History:  Family History  Problem Relation Age of Onset   Cancer Mother        colon   Hypertension Mother    Colon cancer Mother        mid 9's   Lung cancer Father    Cancer Father        lung   Colon polyps Sister    Colon cancer Maternal Uncle    Esophageal cancer Neg Hx    Rectal cancer Neg Hx    Stomach cancer Neg Hx      Current Outpatient Medications:    atenolol (TENORMIN) 50 MG tablet, Take 25 mg by mouth as needed (palpatations)., Disp: , Rfl:    pramipexole (MIRAPEX) 0.125 MG tablet, TAKE 1-2 TABLETS BY MOUTH EVERY NIGHT AT BEDTIME AS NEEDED FOR RESTLESS LEG SYMPTOMS, Disp: 60 tablet, Rfl: 5  Review of Systems:  Negative unless indicated in HPI.   Physical Exam: Vitals:   01/05/23 1601  BP: 102/68  Pulse: 71  Temp: 98.2 F (36.8 C)  TempSrc: Oral  SpO2: 99%  Weight: 160 lb 4.8 oz (72.7 kg)    Body mass index is 22.36 kg/m.   Physical Exam HENT:     Right Ear:  Hearing, tympanic membrane, ear canal and external ear normal.     Left Ear: There is impacted cerumen.      Impression and Plan:  Impacted cerumen of left ear  Decreased hearing of left ear  Cerumen Desimpaction  -After patient consent was obtained, warm water was applied and gentle ear lavage performed on left ear. There were no complications and following the desimpaction the tympanic membranes were visible. Tympanic membranes are intact following the procedure. Auditory canals are normal. The patient reported relief of symptoms after removal of cerumen.    Time spent:23 minutes reviewing chart, interviewing and examining patient and formulating plan of care.     Chaya Jan, MD [facility] at [facility]

## 2023-01-07 DIAGNOSIS — M13842 Other specified arthritis, left hand: Secondary | ICD-10-CM | POA: Diagnosis not present

## 2023-01-07 DIAGNOSIS — M13841 Other specified arthritis, right hand: Secondary | ICD-10-CM | POA: Diagnosis not present

## 2023-01-07 DIAGNOSIS — S63630A Sprain of interphalangeal joint of right index finger, initial encounter: Secondary | ICD-10-CM | POA: Diagnosis not present

## 2023-01-07 DIAGNOSIS — R2232 Localized swelling, mass and lump, left upper limb: Secondary | ICD-10-CM | POA: Diagnosis not present

## 2023-01-21 DIAGNOSIS — G4733 Obstructive sleep apnea (adult) (pediatric): Secondary | ICD-10-CM | POA: Diagnosis not present

## 2023-03-20 ENCOUNTER — Encounter: Payer: Self-pay | Admitting: Family Medicine

## 2023-03-20 MED ORDER — PRAMIPEXOLE DIHYDROCHLORIDE 0.125 MG PO TABS: ORAL_TABLET | ORAL | 2 refills | Status: DC

## 2023-04-02 ENCOUNTER — Telehealth: Payer: Self-pay

## 2023-04-02 NOTE — Telephone Encounter (Signed)
LVM for patient to call back [phone], or to call PCP office to schedule follow up apt. AS, CMA  

## 2023-04-14 ENCOUNTER — Telehealth (INDEPENDENT_AMBULATORY_CARE_PROVIDER_SITE_OTHER): Payer: Medicare HMO | Admitting: Family Medicine

## 2023-04-14 ENCOUNTER — Encounter: Payer: Self-pay | Admitting: Family Medicine

## 2023-04-14 VITALS — Temp 100.1°F | Ht 71.0 in | Wt 160.3 lb

## 2023-04-14 DIAGNOSIS — R051 Acute cough: Secondary | ICD-10-CM | POA: Diagnosis not present

## 2023-04-14 MED ORDER — HYDROCODONE BIT-HOMATROP MBR 5-1.5 MG/5ML PO SOLN: 5.0000 mL | Freq: Four times a day (QID) | ORAL | 0 refills | Status: DC | PRN

## 2023-04-14 NOTE — Progress Notes (Signed)
Patient ID: Eric Kaufman, male   DOB: 1951/06/14, 72 y.o.   MRN: [id number]   Virtual Visit via Video Note  I connected with Danley Danker on 04/14/23 at  3:15 PM EDT by a video enabled telemedicine application and verified that I am speaking with the correct person using two identifiers.  Location patient: home Location provider:work or home office Persons participating in the virtual visit: patient, provider  I discussed the limitations of evaluation and management by telemedicine and the availability of in person appointments. The patient expressed understanding and agreed to proceed.   HPI: [name] developed cough last night.  His wife was actually diagnosed with COVID on Saturday he is fairly certain he has COVID now.  Low-grade fever.  Mild nasal congestion.  Cough has been most bothersome.  Cough interfering with sleep at night.  No relief previously with Robitussin DM.  No nausea, vomiting, or diarrhea.  No dyspnea.  He is generally very healthy.  Does have obstructive sleep apnea on CPAP.   ROS: See pertinent positives and negatives per HPI.  Past Medical History:  Diagnosis Date   Anxiety    Palpitations    Papilledema    PSVT    Sleep apnea    mild uses cpap    Past Surgical History:  Procedure Laterality Date   COLONOSCOPY  2013   COLONOSCOPY WITH PROPOFOL  05/14/2016   Dr.Stark   heart ablation N/A 02/16/2015   NASAL SINUS SURGERY  04/03/2015   POLYPECTOMY      Family History  Problem Relation Age of Onset   Cancer Mother        colon   Hypertension Mother    Colon cancer Mother        mid 9's   Lung cancer Father    Cancer Father        lung   Colon polyps Sister    Colon cancer Maternal Uncle    Esophageal cancer Neg Hx    Rectal cancer Neg Hx    Stomach cancer Neg Hx     SOCIAL HX: Non-smoker   Current Outpatient Medications:    atenolol (TENORMIN) 50 MG tablet, Take 25 mg by mouth as needed (palpatations)., Disp: , Rfl:    HYDROcodone  bit-homatropine (HYCODAN) 5-1.5 MG/5ML syrup, Take 5 mLs by mouth every 6 (six) hours as needed for cough., Disp: 120 mL, Rfl: 0   pramipexole (MIRAPEX) 0.125 MG tablet, TAKE 1-2 TABLETS BY MOUTH EVERY NIGHT AT BEDTIME AS NEEDED FOR RESTLESS LEG SYMPTOMS, Disp: 60 tablet, Rfl: 2  EXAM:  VITALS per patient if applicable:    ASSESSMENT AND PLAN:  Discussed the following assessment and plan:  Cough.  Suspect acute COVID infection.  Wife just recently diagnosed with COVID and his symptoms are identical.  We did discuss possible antivirals and he is not interested.  Recommend plenty fluids and rest.  Sent in limited Hycodan cough syrup 1 teaspoon every 6 hours as needed for severe cough.  Follow-up for any persistent or worsening symptoms.  Discussed isolation recommendations     I discussed the assessment and treatment plan with the patient. The patient was provided an opportunity to ask questions and all were answered. The patient agreed with the plan and demonstrated an understanding of the instructions.   The patient was advised to call back or seek an in-person evaluation if the symptoms worsen or if the condition fails to improve as anticipated.     [name], MD

## 2023-05-10 DIAGNOSIS — R69 Illness, unspecified: Secondary | ICD-10-CM | POA: Diagnosis not present

## 2023-05-27 DIAGNOSIS — G4733 Obstructive sleep apnea (adult) (pediatric): Secondary | ICD-10-CM | POA: Diagnosis not present

## 2023-07-10 ENCOUNTER — Encounter: Payer: Self-pay | Admitting: Family Medicine

## 2023-07-10 ENCOUNTER — Ambulatory Visit (INDEPENDENT_AMBULATORY_CARE_PROVIDER_SITE_OTHER): Payer: Medicare HMO | Admitting: Family Medicine

## 2023-07-10 VITALS — BP 112/60 | HR 50 | Temp 97.7°F | Ht 71.0 in | Wt 156.2 lb

## 2023-07-10 DIAGNOSIS — R002 Palpitations: Secondary | ICD-10-CM

## 2023-07-10 DIAGNOSIS — E785 Hyperlipidemia, unspecified: Secondary | ICD-10-CM | POA: Diagnosis not present

## 2023-07-10 DIAGNOSIS — G2581 Restless legs syndrome: Secondary | ICD-10-CM

## 2023-07-10 MED ORDER — PRAMIPEXOLE DIHYDROCHLORIDE 0.125 MG PO TABS: ORAL_TABLET | ORAL | 5 refills | Status: DC

## 2023-07-10 NOTE — Progress Notes (Signed)
Established Patient Office Visit  Subjective   Patient ID: Eric Kaufman, male    DOB: [birth year]  Age: 72 y.o. MRN: [id number]  Chief Complaint  Patient presents with   Palpitations   Back Pain   Medication Consultation    HPI   [name] has longstanding history of palpitations.  He has had both paroxysmal atrial fibrillation and PSVT in the past.  He had ablation procedure about 8 years ago.  Followed yearly by cardiologist over in [facility].  He has recently had a flareup with increased palpitations and has been taking atenolol daily.  Also took flecainide for a brief while which has been prescribed per his cardiologist and that seems to help.  Minimal caffeine use.  Very rare alcohol use.  No recent chest pains.  Very active and has done some recent vigorous exercise without difficulty.  Last EKG here 8/23 showed PACs.  He states has had symptomatic PACs for years.  Feels this may have been triggered by some stress recently.  He has had some recent low back pains.  Had questions regarding whether he can take Advil.  Previous kidney functions been normal.  No history of peptic ulcer disease.  No recent labs.  History of mild hyperlipidemia.  He would like to get follow-up fasting labs.  He has restless leg syndrome and takes low-dose Mirapex 0.125 mg usually 1 at night.  This does help his symptoms.  Requesting refills.  Past Medical History:  Diagnosis Date   Anxiety    Palpitations    Papilledema    PSVT    Sleep apnea    mild uses cpap   Past Surgical History:  Procedure Laterality Date   COLONOSCOPY  2013   COLONOSCOPY WITH PROPOFOL  05/14/2016   Dr.Stark   heart ablation N/A 02/16/2015   NASAL SINUS SURGERY  04/03/2015   POLYPECTOMY      reports that he has never smoked. He has never used smokeless tobacco. He reports that he does not currently use alcohol. He reports that he does not use drugs. family history includes Cancer in his father and mother; Colon cancer in  his maternal uncle and mother; Colon polyps in his sister; Hypertension in his mother; Lung cancer in his father. Allergies  Allergen Reactions   Aspirin Swelling   Penicillins     Per pt: unknown    Review of Systems  Constitutional:  Negative for malaise/fatigue and weight loss.  Eyes:  Negative for blurred vision.  Respiratory:  Negative for shortness of breath.   Cardiovascular:  Positive for palpitations. Negative for chest pain, leg swelling and PND.  Neurological:  Negative for dizziness, weakness and headaches.      Objective:     BP 112/60 (BP Location: Left Arm, Patient Position: Sitting, Cuff Size: Normal)   Pulse (!) 50   Temp 97.7 F (36.5 C) (Oral)   Ht 5\' 11"  (1.803 m)   Wt 156 lb 3.2 oz (70.9 kg)   SpO2 99%   BMI 21.79 kg/m  BP Readings from Last 3 Encounters:  07/10/23 112/60  01/05/23 102/68  07/21/22 120/62   Wt Readings from Last 3 Encounters:  07/10/23 156 lb 3.2 oz (70.9 kg)  04/14/23 160 lb 4.8 oz (72.7 kg)  01/05/23 160 lb 4.8 oz (72.7 kg)      Physical Exam Vitals reviewed.  Constitutional:      General: He is not in acute distress.    Appearance: He is well-developed.  Eyes:     Pupils: Pupils are equal, round, and reactive to light.  Neck:     Thyroid: No thyromegaly.  Cardiovascular:     Rate and Rhythm: Normal rate and regular rhythm.     Heart sounds:     No gallop.  Pulmonary:     Effort: Pulmonary effort is normal. No respiratory distress.     Breath sounds: Normal breath sounds. No wheezing or rales.  Neurological:     General: No focal deficit present.     Mental Status: He is alert and oriented to person, place, and time.      No results found for any visits on 07/10/23.    The ASCVD Risk score (Arnett DK, et al., 2019) failed to calculate for the following reasons:   Cannot find a previous HDL lab   Cannot find a previous total cholesterol lab    Assessment & Plan:   Problem List Items Addressed This Visit        Unprioritized   Restless legs   Relevant Orders   CBC with Differential/Platelet   Other Visit Diagnoses     Palpitation    -  Primary   Relevant Orders   Basic metabolic panel   TSH   Magnesium   Hyperlipidemia, unspecified hyperlipidemia type       Relevant Orders   Lipid panel   Hepatic function panel     Longstanding history of palpitations with prior ablation about 8 years ago for paroxysmal atrial fibrillation and PSVT.  He has ongoing intermittent PACs.  He is aware of risk of triggers from things like stress, caffeine, alcohol.  Minimal caffeine and alcohol use.  He has very regular rhythm on exam today with rate low 50s.  -Continue low-dose atenolol as needed for symptoms -Continue close follow-up with cardiology -No recent labs.  Obtain chemistries and include magnesium level -Also check TSH with labs above  Restless leg syndrome stable on low-dose Mirapex.  Continue low caffeine consumption.  Refill Mirapex for 1 year  History of very mild elevated LDL cholesterol.  No lipids in years.  Schedule for fasting lipid panel  No follow-ups on file.    [name], MD

## 2023-07-17 DIAGNOSIS — J329 Chronic sinusitis, unspecified: Secondary | ICD-10-CM | POA: Diagnosis not present

## 2023-07-17 DIAGNOSIS — G4733 Obstructive sleep apnea (adult) (pediatric): Secondary | ICD-10-CM | POA: Diagnosis not present

## 2023-07-17 DIAGNOSIS — I480 Paroxysmal atrial fibrillation: Secondary | ICD-10-CM | POA: Diagnosis not present

## 2023-07-17 DIAGNOSIS — R002 Palpitations: Secondary | ICD-10-CM | POA: Diagnosis not present

## 2023-07-22 ENCOUNTER — Encounter: Payer: Self-pay | Admitting: Family Medicine

## 2023-07-22 ENCOUNTER — Other Ambulatory Visit (INDEPENDENT_AMBULATORY_CARE_PROVIDER_SITE_OTHER): Payer: Medicare HMO

## 2023-07-22 ENCOUNTER — Other Ambulatory Visit: Payer: Medicare HMO

## 2023-07-22 DIAGNOSIS — G2581 Restless legs syndrome: Secondary | ICD-10-CM | POA: Diagnosis not present

## 2023-07-22 DIAGNOSIS — R002 Palpitations: Secondary | ICD-10-CM

## 2023-07-22 DIAGNOSIS — I4719 Other supraventricular tachycardia: Secondary | ICD-10-CM | POA: Diagnosis not present

## 2023-07-22 DIAGNOSIS — I493 Ventricular premature depolarization: Secondary | ICD-10-CM | POA: Diagnosis not present

## 2023-07-22 DIAGNOSIS — I491 Atrial premature depolarization: Secondary | ICD-10-CM | POA: Diagnosis not present

## 2023-07-22 DIAGNOSIS — E785 Hyperlipidemia, unspecified: Secondary | ICD-10-CM | POA: Diagnosis not present

## 2023-07-22 LAB — CBC WITH DIFFERENTIAL/PLATELET
Basophils Absolute: 0 10*3/uL (ref 0.0–0.1)
Basophils Relative: 0.7 % (ref 0.0–3.0)
Eosinophils Absolute: 0.4 10*3/uL (ref 0.0–0.7)
Eosinophils Relative: 7.5 % — ABNORMAL HIGH (ref 0.0–5.0)
HCT: 48.8 % (ref 39.0–52.0)
Hemoglobin: 16.4 g/dL (ref 13.0–17.0)
Lymphocytes Relative: 26.5 % (ref 12.0–46.0)
Lymphs Abs: 1.3 10*3/uL (ref 0.7–4.0)
MCHC: 33.5 g/dL (ref 30.0–36.0)
MCV: 94.9 fL (ref 78.0–100.0)
Monocytes Absolute: 0.6 10*3/uL (ref 0.1–1.0)
Monocytes Relative: 13 % — ABNORMAL HIGH (ref 3.0–12.0)
Neutro Abs: 2.5 10*3/uL (ref 1.4–7.7)
Neutrophils Relative %: 52.3 % (ref 43.0–77.0)
Platelets: 314 10*3/uL (ref 150.0–400.0)
RBC: 5.15 Mil/uL (ref 4.22–5.81)
RDW: 13.4 % (ref 11.5–15.5)
WBC: 4.9 10*3/uL (ref 4.0–10.5)

## 2023-07-22 LAB — BASIC METABOLIC PANEL
BUN: 16 mg/dL (ref 6–23)
CO2: 30 meq/L (ref 19–32)
Calcium: 8.9 mg/dL (ref 8.4–10.5)
Chloride: 102 meq/L (ref 96–112)
Creatinine, Ser: 0.82 mg/dL (ref 0.40–1.50)
GFR: 88.51 mL/min (ref >60.00)
Glucose, Bld: 92 mg/dL (ref 70–99)
Potassium: 4 meq/L (ref 3.5–5.1)
Sodium: 140 meq/L (ref 135–145)

## 2023-07-22 LAB — LIPID PANEL
Cholesterol: 166 mg/dL (ref 0–200)
HDL: 51.7 mg/dL (ref >39.00)
LDL Cholesterol: 94 mg/dL (ref 0–99)
NonHDL: 114.03
Total CHOL/HDL Ratio: 3
Triglycerides: 102 mg/dL (ref 0.0–149.0)
VLDL: 20.4 mg/dL (ref 0.0–40.0)

## 2023-07-22 LAB — MAGNESIUM: Magnesium: 2.2 mg/dL (ref 1.5–2.5)

## 2023-07-22 LAB — HEPATIC FUNCTION PANEL
ALT: 17 U/L (ref 0–53)
AST: 16 U/L (ref 0–37)
Albumin: 4.2 g/dL (ref 3.5–5.2)
Alkaline Phosphatase: 76 U/L (ref 39–117)
Bilirubin, Direct: 0.2 mg/dL (ref 0.0–0.3)
Total Bilirubin: 1 mg/dL (ref 0.2–1.2)
Total Protein: 6.8 g/dL (ref 6.0–8.3)

## 2023-07-22 LAB — TSH: TSH: 1.44 u[IU]/mL (ref 0.35–5.50)

## 2023-07-23 DIAGNOSIS — I493 Ventricular premature depolarization: Secondary | ICD-10-CM | POA: Diagnosis not present

## 2023-07-23 DIAGNOSIS — I444 Left anterior fascicular block: Secondary | ICD-10-CM | POA: Diagnosis not present

## 2023-07-30 DIAGNOSIS — I480 Paroxysmal atrial fibrillation: Secondary | ICD-10-CM | POA: Diagnosis not present

## 2023-08-01 DIAGNOSIS — R69 Illness, unspecified: Secondary | ICD-10-CM | POA: Diagnosis not present

## 2023-08-04 DIAGNOSIS — I4719 Other supraventricular tachycardia: Secondary | ICD-10-CM | POA: Diagnosis not present

## 2023-08-04 DIAGNOSIS — I493 Ventricular premature depolarization: Secondary | ICD-10-CM | POA: Diagnosis not present

## 2023-08-04 DIAGNOSIS — I491 Atrial premature depolarization: Secondary | ICD-10-CM | POA: Diagnosis not present

## 2023-08-12 DIAGNOSIS — I491 Atrial premature depolarization: Secondary | ICD-10-CM | POA: Diagnosis not present

## 2023-08-12 DIAGNOSIS — R5383 Other fatigue: Secondary | ICD-10-CM | POA: Diagnosis not present

## 2023-08-12 DIAGNOSIS — R69 Illness, unspecified: Secondary | ICD-10-CM | POA: Diagnosis not present

## 2023-08-12 DIAGNOSIS — T447X5A Adverse effect of beta-adrenoreceptor antagonists, initial encounter: Secondary | ICD-10-CM | POA: Diagnosis not present

## 2023-08-12 DIAGNOSIS — I4719 Other supraventricular tachycardia: Secondary | ICD-10-CM | POA: Diagnosis not present

## 2023-08-12 DIAGNOSIS — G2581 Restless legs syndrome: Secondary | ICD-10-CM | POA: Diagnosis not present

## 2023-08-17 ENCOUNTER — Encounter: Payer: Self-pay | Admitting: Family Medicine

## 2023-08-17 DIAGNOSIS — R002 Palpitations: Secondary | ICD-10-CM

## 2023-08-25 NOTE — Progress Notes (Signed)
Referring-Bruce Burchett, MD Reason for referral-palpitations  HPI: 72 year old male for evaluation of palpitations at request of Edwin Cap, MD.  Laboratories November 2024 showed potassium 4.0, creatinine 0.82, magnesium 2.2, normal liver functions, TSH 1.44, hemoglobin 16.4, cholesterol 166 with LDL 94 and HDL 51.7.  Patient has previously been seen by Dr. Clydie Braun at [facility].  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  Per notes he has had a history of rare atrial fibrillation.  Also with symptomatic PACs treated with flecainide.  He had a Zio patch November 2024 that showed short episodes of atrial tachycardia and rare PACs and PVCs.  Patient is very physically active and denies dyspnea on exertion, orthopnea, PND, pedal edema, syncope or chest pain.  He occasionally feels palpitations described as a skip.  Current Outpatient Medications  Medication Sig Dispense Refill   atenolol (TENORMIN) 50 MG tablet Take 25 mg by mouth as needed (palpatations).     pramipexole (MIRAPEX) 0.125 MG tablet TAKE 1-2 TABLETS BY MOUTH EVERY NIGHT AT BEDTIME AS NEEDED FOR RESTLESS LEG SYMPTOMS 60 tablet 5   No current facility-administered medications for this visit.    Allergies  Allergen Reactions   Aspirin Swelling   Penicillins     Per pt: unknown     Past Medical History:  Diagnosis Date   Anxiety    PAF (paroxysmal atrial fibrillation) (HCC)    Palpitations    Papilledema    PSVT    Sleep apnea    mild uses cpap   Thyroiditis     Past Surgical History:  Procedure Laterality Date   COLONOSCOPY  2013   COLONOSCOPY WITH PROPOFOL  05/14/2016   Dr.Stark   heart ablation N/A 02/16/2015   NASAL SINUS SURGERY  04/03/2015   POLYPECTOMY      Social History   Socioeconomic History   Marital status: Married    Spouse name: Not on file   Number of children: 1   Years of education: Not on file   Highest education level: Not on file   Occupational History   Occupation: Artist    Employer: SELF EMPLOYED  Tobacco Use   Smoking status: Never   Smokeless tobacco: Never  Vaping Use   Vaping status: Never Used  Substance and Sexual Activity   Alcohol use: Yes    Comment: rarely   Drug use: No   Sexual activity: Not on file  Other Topics Concern   Not on file  Social History Narrative   Not on file   Social Drivers of Health   Financial Resource Strain: Not on file  Food Insecurity: Not on file  Transportation Needs: Not on file  Physical Activity: Not on file  Stress: Not on file  Social Connections: Not on file  Intimate Partner Violence: Not on file    Family History  Problem Relation Age of Onset   Cancer Mother        colon   Hypertension Mother    Colon cancer Mother        mid 42's   Lung cancer Father    Cancer Father        lung   Colon polyps Sister    Colon cancer Maternal Uncle    Esophageal cancer Neg Hx    Rectal cancer Neg Hx    Stomach cancer Neg Hx     ROS: no fevers or chills, productive cough, hemoptysis, dysphasia, odynophagia, melena, hematochezia, dysuria, hematuria, rash,  seizure activity, orthopnea, PND, pedal edema, claudication. Remaining systems are negative.  Physical Exam:   Blood pressure 122/70, pulse 71, height 5\' 11"  (1.803 m), weight 158 lb (71.7 kg), SpO2 98%.  General:  Well developed/well nourished in NAD Skin warm/dry Patient not depressed No peripheral clubbing Back-normal HEENT-normal/normal eyelids Neck supple/normal carotid upstroke bilaterally; no bruits; no JVD; no thyromegaly chest - CTA/ normal expansion CV - RRR/normal S1 and S2; no murmurs, rubs or gallops;  PMI nondisplaced Abdomen -NT/ND, no HSM, no mass, + bowel sounds, no bruit 2+ femoral pulses, no bruits Ext-no edema, chords, 2+ DP Neuro-grossly nonfocal  EKG Interpretation Date/Time:  Monday August 31 2023 10:04:11 EST Ventricular Rate:  77 PR Interval:  144 QRS  Duration:  92 QT Interval:  380 QTC Calculation: 430 R Axis:   -64  Text Interpretation: Normal sinus rhythm Left anterior fascicular block Confirmed by Olga Millers ([id number]) on 08/31/2023 10:07:49 AM    A/P  1 palpitations-patient will continue on atenolol as needed.  At present he is taking flecainide 50 mg once daily but plans to discontinue this in the near future.  Can consider repeat monitor in the future if necessary.  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  He apparently had 1 recurrent episode of atrial fibrillation in the setting of an upper respiratory infection based on reports.  3 history of sleep apnea-continue CPAP.  Olga Millers, MD

## 2023-08-31 ENCOUNTER — Ambulatory Visit: Payer: Medicare HMO | Attending: Cardiology | Admitting: Cardiology

## 2023-08-31 ENCOUNTER — Encounter: Payer: Self-pay | Admitting: Cardiology

## 2023-08-31 VITALS — BP 122/70 | HR 71 | Ht 71.0 in | Wt 158.0 lb

## 2023-08-31 DIAGNOSIS — I471 Supraventricular tachycardia, unspecified: Secondary | ICD-10-CM

## 2023-08-31 DIAGNOSIS — R002 Palpitations: Secondary | ICD-10-CM

## 2023-08-31 NOTE — Patient Instructions (Signed)
   Follow-Up: At [facility], you and your health needs are our priority.  As part of our continuing mission to provide you with exceptional heart care, we have created designated Provider Care Teams.  These Care Teams include your primary Cardiologist (physician) and Advanced Practice Providers (APPs -  Physician Assistants and Nurse Practitioners) who all work together to provide you with the care you need, when you need it.    Your next appointment:   12 month(s)  Provider:   Olga Millers MD

## 2023-09-06 ENCOUNTER — Encounter: Payer: Self-pay | Admitting: Cardiology

## 2023-09-07 DIAGNOSIS — G4733 Obstructive sleep apnea (adult) (pediatric): Secondary | ICD-10-CM | POA: Diagnosis not present

## 2023-10-08 DIAGNOSIS — G4733 Obstructive sleep apnea (adult) (pediatric): Secondary | ICD-10-CM | POA: Diagnosis not present

## 2023-10-27 DIAGNOSIS — G4733 Obstructive sleep apnea (adult) (pediatric): Secondary | ICD-10-CM | POA: Diagnosis not present

## 2023-11-03 DIAGNOSIS — I480 Paroxysmal atrial fibrillation: Secondary | ICD-10-CM | POA: Diagnosis not present

## 2023-11-03 DIAGNOSIS — G4733 Obstructive sleep apnea (adult) (pediatric): Secondary | ICD-10-CM | POA: Diagnosis not present

## 2023-11-06 DIAGNOSIS — G4733 Obstructive sleep apnea (adult) (pediatric): Secondary | ICD-10-CM | POA: Diagnosis not present

## 2023-11-18 ENCOUNTER — Encounter: Payer: Self-pay | Admitting: Family Medicine

## 2023-11-18 ENCOUNTER — Ambulatory Visit (INDEPENDENT_AMBULATORY_CARE_PROVIDER_SITE_OTHER): Admitting: Family Medicine

## 2023-11-18 VITALS — BP 130/66 | HR 58 | Temp 97.5°F | Wt 156.4 lb

## 2023-11-18 DIAGNOSIS — R002 Palpitations: Secondary | ICD-10-CM | POA: Diagnosis not present

## 2023-11-18 DIAGNOSIS — G2581 Restless legs syndrome: Secondary | ICD-10-CM

## 2023-11-18 NOTE — Progress Notes (Signed)
 Established Patient Office Visit  Subjective   Patient ID: Eric Kaufman, male    DOB: 11/04/1950  Age: 73 y.o. MRN: [id number]  Chief Complaint  Patient presents with   Palpitations    Patient complains of palpitations, x4 days     HPI   Eric Kaufman is seen today via recent increase in palpitations.  He has a longstanding history of palpitations.  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 Sampson Kaufman with [facility] back in 2016.  Has had some occasional symptomatic PACs and PVCs since then.  He had cardiac event monitor back in November which showed only 4 very brief supraventricular tachycardia episode with only a few beat runs.  He is currently on flecainide 50 mg twice daily.  He was also prescribed diltiazem 30 mg to take 1 every 6 hours as needed for palpitations.  He has very good level of fitness and stays very active.  He has noticed that his palpitations tend to be more at rest especially at night and actually improved with walking.  Does not have any postexercise palpitations.  No recent chest pains.  He does take pramipexole for restless legs and thinks that may be possibly exacerbating symptoms.  He did pose this question to his cardiologist few months ago.  He drinks essentially no caffeine.  No alcohol.  No recent significant dizziness and no syncope.  He had electrolytes and thyroid function as well as magnesium level back in November which were all normal.  Past Medical History:  Diagnosis Date   Anxiety    PAF (paroxysmal atrial fibrillation) (HCC)    Palpitations    Papilledema    PSVT    Sleep apnea    mild uses cpap   Thyroiditis    Past Surgical History:  Procedure Laterality Date   COLONOSCOPY  2013   COLONOSCOPY WITH PROPOFOL  05/14/2016   Dr.Stark   heart ablation N/A 02/16/2015   NASAL SINUS SURGERY  04/03/2015   POLYPECTOMY      reports that he has never smoked. He has never used smokeless tobacco. He  reports current alcohol use. He reports that he does not use drugs. family history includes Cancer in his father and mother; Colon cancer in his maternal uncle and mother; Colon polyps in his sister; Hypertension in his mother; Lung cancer in his father. Allergies  Allergen Reactions   Aspirin Swelling   Penicillins     Per pt: unknown    Review of Systems  Constitutional:  Negative for chills, fever and weight loss.  Respiratory:  Negative for shortness of breath.   Cardiovascular:  Positive for palpitations. Negative for chest pain and leg swelling.  Neurological:  Negative for focal weakness and loss of consciousness.      Objective:     BP 130/66 (BP Location: Left Arm, Patient Position: Sitting, Cuff Size: Normal)   Pulse (!) 58   Temp (!) 97.5 F (36.4 C) (Oral)   Wt 156 lb 6.4 oz (70.9 kg)   SpO2 98%   BMI 21.81 kg/m  BP Readings from Last 3 Encounters:  11/18/23 130/66  08/31/23 122/70  07/10/23 112/60   Wt Readings from Last 3 Encounters:  11/18/23 156 lb 6.4 oz (70.9 kg)  08/31/23 158 lb (71.7 kg)  07/10/23 156 lb 3.2 oz (70.9 kg)      Physical Exam Vitals reviewed.  Constitutional:      General: He is not in  acute distress.    Appearance: He is not ill-appearing.  Cardiovascular:     Rate and Rhythm: Normal rate and regular rhythm.     Heart sounds: No murmur heard. Pulmonary:     Effort: Pulmonary effort is normal.     Breath sounds: Normal breath sounds. No wheezing or rales.  Neurological:     General: No focal deficit present.     Mental Status: He is alert.     Cranial Nerves: No cranial nerve deficit.      No results found for any visits on 11/18/23.  Last CBC Lab Results  Component Value Date   WBC 4.9 07/22/2023   HGB 16.4 07/22/2023   HCT 48.8 07/22/2023   MCV 94.9 07/22/2023   MCH 30.9 01/10/2016   RDW 13.4 07/22/2023   PLT 314.0 07/22/2023   Last metabolic panel Lab Results  Component Value Date   GLUCOSE 92 07/22/2023    NA 140 07/22/2023   K 4.0 07/22/2023   CL 102 07/22/2023   CO2 30 07/22/2023   BUN 16 07/22/2023   CREATININE 0.82 07/22/2023   GFR 88.51 07/22/2023   CALCIUM 8.9 07/22/2023   PROT 6.8 07/22/2023   ALBUMIN 4.2 07/22/2023   BILITOT 1.0 07/22/2023   ALKPHOS 76 07/22/2023   AST 16 07/22/2023   ALT 17 07/22/2023   ANIONGAP 12 01/10/2016   Last lipids Lab Results  Component Value Date   CHOL 166 07/22/2023   HDL 51.70 07/22/2023   LDLCALC 94 07/22/2023   TRIG 102.0 07/22/2023   CHOLHDL 3 07/22/2023   Last thyroid functions Lab Results  Component Value Date   TSH 1.44 07/22/2023      The 10-year ASCVD risk score (Arnett DK, et al., 2019) is: 18%    Assessment & Plan:   Patient seen with longstanding history of intermittent palpitations with 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  He had some intermittent ectopy with PACs and PVCs since then.  He has not noted any significant palpitations with exercise were immediately postexercise and these have been more at rest at night.  He is concerned whether pramipexole could be having an effect.  Apparently pramipexole can have some effects on AV node conduction.  He is currently taking flecainide and as needed diltiazem.  We discussed several items as follows  -Continue to avoid caffeine and alcohol -Continue to stay active with his regular exercise as tolerated -Follow-up immediately with cardiology if he has any significant changes symptoms with any dizziness or increased shortness of breath -We did discuss possible trial off pramipexole if he can tolerate restless leg symptoms.  If he is seeing significant improvement in symptoms off that we could explore other possible restless leg treatment such as low-dose gabapentin -We discussed that any further significant adjustment of cardiac meds should be through his EP cardiology specialist  35 minutes were spent between face-to-face and  non-face-to-face time spent with this visit  Eric Peat, MD

## 2023-11-18 NOTE — Patient Instructions (Signed)
 Try leaving off the Pramipexole to see if this helps reduce your palpitations.

## 2023-11-23 ENCOUNTER — Ambulatory Visit: Attending: Nurse Practitioner | Admitting: Nurse Practitioner

## 2023-11-23 ENCOUNTER — Encounter: Payer: Self-pay | Admitting: Nurse Practitioner

## 2023-11-23 VITALS — BP 124/70 | HR 72 | Ht 71.0 in | Wt 157.4 lb

## 2023-11-23 DIAGNOSIS — R002 Palpitations: Secondary | ICD-10-CM

## 2023-11-23 DIAGNOSIS — I4892 Unspecified atrial flutter: Secondary | ICD-10-CM | POA: Diagnosis not present

## 2023-11-23 DIAGNOSIS — I480 Paroxysmal atrial fibrillation: Secondary | ICD-10-CM | POA: Diagnosis not present

## 2023-11-23 DIAGNOSIS — I493 Ventricular premature depolarization: Secondary | ICD-10-CM

## 2023-11-23 DIAGNOSIS — I491 Atrial premature depolarization: Secondary | ICD-10-CM

## 2023-11-23 DIAGNOSIS — I4719 Other supraventricular tachycardia: Secondary | ICD-10-CM

## 2023-11-23 DIAGNOSIS — G4733 Obstructive sleep apnea (adult) (pediatric): Secondary | ICD-10-CM

## 2023-11-23 NOTE — Patient Instructions (Addendum)
 Medication Instructions:  Your physician recommends that you continue on your current medications as directed. Please refer to the Current Medication list given to you today.  *If you need a refill on your cardiac medications before your next appointment, please call your pharmacy*   Lab Work: BMET, CBC, TSH, Magnesium today  Testing/Procedures: NONE ordered at this time of appointment   Follow-Up: At [facility], you and your health needs are our priority.  As part of our continuing mission to provide you with exceptional heart care, we have created designated Provider Care Teams.  These Care Teams include your primary Cardiologist (physician) and Advanced Practice Providers (APPs -  Physician Assistants and Nurse Practitioners) who all work together to provide you with the care you need, when you need it.  We recommend signing up for the patient portal called "MyChart".  Sign up information is provided on this After Visit Summary.  MyChart is used to connect with patients for Virtual Visits (Telemedicine).  Patients are able to view lab/test results, encounter notes, upcoming appointments, etc.  Non-urgent messages can be sent to your provider as well.   To learn more about what you can do with MyChart, go to [email or web address].    Your next appointment:    4-6 months   Provider:   Olga Millers, MD     Other Instructions Please follow up with your Electrophysiologist Dr. Sampson Goon.     1st Floor: - Lobby - Registration  - Pharmacy  - Lab - Cafe  2nd Floor: - PV Lab - Diagnostic Testing (echo, CT, nuclear med)  3rd Floor: - Vacant  4th Floor: - TCTS (cardiothoracic surgery) - AFib Clinic - Structural Heart Clinic - Vascular Surgery  - Vascular Ultrasound  5th Floor: - [facility] Card[facility] (general and EP) - Clinical Pharmacy for coumadin, hypertension, lipid, weight-loss medications, and med management appointments    Valet parking services  will be available as well.

## 2023-11-23 NOTE — Progress Notes (Unsigned)
 Office Visit    Patient Name: Eric Kaufman Date of Encounter: 11/23/2023  Primary Care Provider:  Kristian Covey, MD Primary Cardiologist:  Olga Millers, MD  Chief Complaint    73 year old male with a history of paroxysmal atrial fibrillation, atrial flutter, AV nodal reentrant tachycardia, PACs, PVCs, and OSA who presents for follow-up related to palpitations.  Past Medical History    Past Medical History:  Diagnosis Date   Anxiety    PAF (paroxysmal atrial fibrillation) (HCC)    Palpitations    Papilledema    PSVT    Sleep apnea    mild uses cpap   Thyroiditis    Past Surgical History:  Procedure Laterality Date   COLONOSCOPY  2013   COLONOSCOPY WITH PROPOFOL  05/14/2016   Dr.Stark   heart ablation N/A 02/16/2015   NASAL SINUS SURGERY  04/03/2015   POLYPECTOMY      Allergies  Allergies  Allergen Reactions   Aspirin Swelling   Penicillins     Per pt: unknown     Labs/Other Studies Reviewed    The following studies were reviewed today:     Recent Labs: 07/22/2023: ALT 17; BUN 16; Creatinine, Ser 0.82; Hemoglobin 16.4; Magnesium 2.2; Platelets 314.0; Potassium 4.0; Sodium 140; TSH 1.44  Recent Lipid Panel    Component Value Date/Time   CHOL 166 07/22/2023 0844   TRIG 102.0 07/22/2023 0844   HDL 51.70 07/22/2023 0844   CHOLHDL 3 07/22/2023 0844   VLDL 20.4 07/22/2023 0844   LDLCALC 94 07/22/2023 0844    History of Present Illness    73 year old male with the above past medical history including paroxysmal atrial fibrillation, atrial flutter, AV nodal reentrant tachycardia, PACs, PVCs, and OSA.  Previously evaluated by Dr. Eden Emms as well as Dr. Clydie Braun of [facility]. He was referred to Dr. Jens Som in 08/2023 in the setting of palpitations.  He has a history of previous AV nodal reentrant tachycardia, right atrial flutter ablation with subsequent rare episodes of breakthrough atrial fibrillation.  He also has a history of  symptomatic PACs treated with flecainide.  Zio patch in November 2024 showed short episodes of atrial tachycardia, rare PACs and PVCs.  He was last seen in the office on 08/31/2023 was stable from a cardiac standpoint.  He noted rare palpitations which he described as a "skip."  He planned to discontinue flecainide.  He presents today for follow-up accompanied by his wife.  Since his last visit He has been table overall from a cardiac standpoint. However, over the past 10 days he has noted increased palpitations.  He denies any associated chest pain, dyspnea, dizziness, presyncope or syncope.  He reached out to his electrophysiologist Dr. Sampson Goon and has not yet received a response.  He has as needed flecainide, diltiazem, and atenolol.  He has been taking flecainide twice daily with improvement in his symptoms.  He denies any recent illness, denies caffeine or alcohol use.  He does note some personal stress.  He has questions regarding his medications for management of palpitations.    Home Medications    Current Outpatient Medications  Medication Sig Dispense Refill   atenolol (TENORMIN) 25 MG tablet Take 25 mg by mouth daily.     diltiazem (CARDIZEM) 30 MG tablet Take 30 mg by mouth 4 (four) times daily. Take one by mouth every 6 hours as needed for palpitations.     flecainide (TAMBOCOR) 50 MG tablet Take 50 mg by mouth 2 (two) times  daily.     pramipexole (MIRAPEX) 0.125 MG tablet TAKE 1-2 TABLETS BY MOUTH EVERY NIGHT AT BEDTIME AS NEEDED FOR RESTLESS LEG SYMPTOMS 60 tablet 5   No current facility-administered medications for this visit.     Review of Systems    He denies chest pain, dyspnea, pnd, orthopnea, n, v, dizziness, syncope, edema, weight gain, or early satiety. All other systems reviewed and are otherwise negative except as noted above.   Physical Exam    VS:  BP 124/70 (BP Location: Left Arm, Patient Position: Sitting, Cuff Size: Normal)   Pulse 72   Ht 5\' 11"  (1.803 m)    Wt 157 lb 6.4 oz (71.4 kg)   SpO2 99%   BMI 21.95 kg/m   GEN: Well nourished, well developed, in no acute distress. HEENT: normal. Neck: Supple, no JVD, carotid bruits, or masses. Cardiac: RRR, no murmurs, rubs, or gallops. No clubbing, cyanosis, edema.  Radials/DP/PT 2+ and equal bilaterally.  Respiratory:  Respirations regular and unlabored, clear to auscultation bilaterally. GI: Soft, nontender, nondistended, BS + x 4. MS: no deformity or atrophy. Skin: warm and dry, no rash. Neuro:  Strength and sensation are intact. Psych: Normal affect.  Accessory Clinical Findings    ECG personally reviewed by me today - EKG Interpretation Date/Time:  Monday November 23 2023 15:13:03 EDT Ventricular Rate:  66 PR Interval:  154 QRS Duration:  90 QT Interval:  390 QTC Calculation: 408 R Axis:   -63  Text Interpretation: Normal sinus rhythm Left anterior fascicular block When compared with ECG of 31-Aug-2023 10:04, No significant change was found Confirmed by Bernadene Person ([id number]) on 11/23/2023 3:41:01 PM  - no acute changes.   Lab Results  Component Value Date   WBC 4.9 07/22/2023   HGB 16.4 07/22/2023   HCT 48.8 07/22/2023   MCV 94.9 07/22/2023   PLT 314.0 07/22/2023   Lab Results  Component Value Date   CREATININE 0.82 07/22/2023   BUN 16 07/22/2023   NA 140 07/22/2023   K 4.0 07/22/2023   CL 102 07/22/2023   CO2 30 07/22/2023   Lab Results  Component Value Date   ALT 17 07/22/2023   AST 16 07/22/2023   ALKPHOS 76 07/22/2023   BILITOT 1.0 07/22/2023   Lab Results  Component Value Date   CHOL 166 07/22/2023   HDL 51.70 07/22/2023   LDLCALC 94 07/22/2023   TRIG 102.0 07/22/2023   CHOLHDL 3 07/22/2023    No results found for: "HGBA1C"  Assessment & Plan    1. Palpitations/PACs/PVCs/paroxysmal atrial fibrillation/atrial flutter/AV node reentrant tachycardia: History of  AV nodal reentrant tachycardia, right atrial flutter ablation with subsequent rare episodes of  breakthrough atrial fibrillation.  He also has a history of symptomatic PACs treated with flecainide.  Zio patch in November 2024 showed short episodes of atrial tachycardia, rare PACs and PVCs.  He follows with Dr. Sampson Goon, EP, of [facility].  He has prescription for flecainide, diltiazem, and atenolol to use as needed for palpitations.  He notes a 10-day history of increased palpitations, no associated symptoms.  He has been taking his flecainide twice daily with improvement in his symptoms.  EKG today shows sinus rhythm.  Will check CBC, BMET, TSH and magnesium.  Offered repeat echocardiogram, repeat cardiac monitor, he declines.  He does have questions regarding his medications for management of palpitations. We agreed this would best be answered by his EP, Dr. Sampson Goon.  Advised follow-up with EP.  Reviewed ED precautions.  For now, continue diltiazem, flecainide, atenolol as needed.  2. OSA: Reports adherence to CPAP.  3. Disposition: Follow-up in 4 to 6 months with Dr. Jens Som.      Joylene Grapes, NP 11/23/2023, 3:41 PM

## 2023-11-24 ENCOUNTER — Encounter: Payer: Self-pay | Admitting: Nurse Practitioner

## 2023-11-24 LAB — CBC
Hematocrit: 48.6 % (ref 37.5–51.0)
Hemoglobin: 16.2 g/dL (ref 13.0–17.7)
MCH: 32 pg (ref 26.6–33.0)
MCHC: 33.3 g/dL (ref 31.5–35.7)
MCV: 96 fL (ref 79–97)
Platelets: 294 10*3/uL (ref 150–450)
RBC: 5.07 x10E6/uL (ref 4.14–5.80)
RDW: 12.5 % (ref 11.6–15.4)
WBC: 6.3 10*3/uL (ref 3.4–10.8)

## 2023-11-24 LAB — BASIC METABOLIC PANEL
BUN/Creatinine Ratio: 25 — ABNORMAL HIGH (ref 10–24)
BUN: 20 mg/dL (ref 8–27)
CO2: 25 mmol/L (ref 20–29)
Calcium: 9.1 mg/dL (ref 8.6–10.2)
Chloride: 103 mmol/L (ref 96–106)
Creatinine, Ser: 0.81 mg/dL (ref 0.76–1.27)
Glucose: 90 mg/dL (ref 70–99)
Potassium: 4.7 mmol/L (ref 3.5–5.2)
Sodium: 142 mmol/L (ref 134–144)
eGFR: 94 mL/min/{1.73_m2} (ref >59)

## 2023-11-24 LAB — MAGNESIUM: Magnesium: 2.4 mg/dL — ABNORMAL HIGH (ref 1.6–2.3)

## 2023-11-24 LAB — TSH: TSH: 0.821 u[IU]/mL (ref 0.450–4.500)

## 2023-12-02 ENCOUNTER — Ambulatory Visit (INDEPENDENT_AMBULATORY_CARE_PROVIDER_SITE_OTHER): Admitting: Family Medicine

## 2023-12-02 ENCOUNTER — Encounter: Payer: Self-pay | Admitting: Family Medicine

## 2023-12-02 VITALS — BP 110/60 | HR 49 | Temp 97.6°F | Wt 157.7 lb

## 2023-12-02 DIAGNOSIS — R21 Rash and other nonspecific skin eruption: Secondary | ICD-10-CM | POA: Diagnosis not present

## 2023-12-02 MED ORDER — NAFTIN 1 % EX GEL: 1.0000 | Freq: Two times a day (BID) | CUTANEOUS | 1 refills | Status: DC

## 2023-12-02 NOTE — Patient Instructions (Signed)
 Keep area as dry as possible  Use the gel twice daily.   Be in touch if not clearing in 3-4 weeks.

## 2023-12-02 NOTE — Progress Notes (Signed)
 Established Patient Office Visit  Subjective   Patient ID: Eric Kaufman, male    DOB: 1951/06/24  Age: 73 y.o. MRN: [id number]  Chief Complaint  Patient presents with   Rash    HPI   Here with skin rash lower back region near the midline.  He noticed this back in July.  Did use some Lotrimin and almost fully resolved but has recurred.  Denies any other areas of rash.  Current rash is slightly scaly in the perimeter.  Denies any recent fever.  He does a fair amount of gardening and wonders if he may have picked up something from the soil in terms of fungal infection.  Past Medical History:  Diagnosis Date   Anxiety    PAF (paroxysmal atrial fibrillation) (HCC)    Palpitations    Papilledema    PSVT    Sleep apnea    mild uses cpap   Thyroiditis    Past Surgical History:  Procedure Laterality Date   COLONOSCOPY  2013   COLONOSCOPY WITH PROPOFOL  05/14/2016   Dr.Stark   heart ablation N/A 02/16/2015   NASAL SINUS SURGERY  04/03/2015   POLYPECTOMY      reports that he has never smoked. He has never used smokeless tobacco. He reports current alcohol use. He reports that he does not use drugs. family history includes Cancer in his father and mother; Colon cancer in his maternal uncle and mother; Colon polyps in his sister; Hypertension in his mother; Lung cancer in his father. Allergies  Allergen Reactions   Aspirin Swelling   Penicillins     Per pt: unknown    Review of Systems  Constitutional:  Negative for chills and fever.  Skin:  Positive for rash.      Objective:     BP 110/60 (BP Location: Left Arm, Patient Position: Sitting, Cuff Size: Normal)   Pulse (!) 49   Temp 97.6 F (36.4 C) (Oral)   Wt 157 lb 11.2 oz (71.5 kg)   SpO2 97%   BMI 21.99 kg/m  BP Readings from Last 3 Encounters:  12/02/23 110/60  11/23/23 124/70  11/18/23 130/66   Wt Readings from Last 3 Encounters:  12/02/23 157 lb 11.2 oz (71.5 kg)  11/23/23 157 lb 6.4 oz (71.4 kg)   11/18/23 156 lb 6.4 oz (70.9 kg)      Physical Exam Vitals reviewed.  Constitutional:      General: He is not in acute distress.    Appearance: He is not ill-appearing.  Skin:    Findings: Rash present.     Comments: Skin rash lower lumbar area near the midline.  He has fairly well-demarcated border which is slightly raised with some clearing in the center.  Scaly border.  Neurological:     Mental Status: He is alert.      No results found for any visits on 12/02/23.    The 10-year ASCVD risk score (Arnett DK, et al., 2019) is: 13.7%    Assessment & Plan:   Problem List Items Addressed This Visit   None Visit Diagnoses       Skin rash    -  Primary   Relevant Orders   Fungal stain   Culture, Fungus with Smear     Rule out fungal skin rash.  This clinically looks more fungal as opposed to granuloma annulare or erythema migrans type rash.  Does have scaly border.  -We does #15 blade and obtained scraping for KOH  and will send for fungal culture -Start Naftin gel 1% apply twice daily and be in touch if rash not fully clearing in 3 to 4 weeks  No follow-ups on file.    [name], MD

## 2023-12-03 ENCOUNTER — Other Ambulatory Visit: Payer: Self-pay

## 2023-12-03 ENCOUNTER — Telehealth: Payer: Self-pay

## 2023-12-03 DIAGNOSIS — R7989 Other specified abnormal findings of blood chemistry: Secondary | ICD-10-CM

## 2023-12-03 NOTE — Telephone Encounter (Signed)
 Lab results sent to pt via mychart with recommendations of repeat lab work in 2 weeks. Orders placed.

## 2023-12-06 DIAGNOSIS — G4733 Obstructive sleep apnea (adult) (pediatric): Secondary | ICD-10-CM | POA: Diagnosis not present

## 2023-12-15 DIAGNOSIS — I480 Paroxysmal atrial fibrillation: Secondary | ICD-10-CM | POA: Diagnosis not present

## 2023-12-15 DIAGNOSIS — I493 Ventricular premature depolarization: Secondary | ICD-10-CM | POA: Diagnosis not present

## 2023-12-24 ENCOUNTER — Encounter: Payer: Self-pay | Admitting: Family Medicine

## 2024-01-01 LAB — FUNGUS CULTURE W SMEAR
CULTURE:: NO GROWTH
MICRO NUMBER:: 16250475
SMEAR:: NONE SEEN
SPECIMEN QUALITY:: ADEQUATE

## 2024-01-06 DIAGNOSIS — G4733 Obstructive sleep apnea (adult) (pediatric): Secondary | ICD-10-CM | POA: Diagnosis not present

## 2024-01-25 DIAGNOSIS — R0609 Other forms of dyspnea: Secondary | ICD-10-CM | POA: Diagnosis not present

## 2024-01-28 DIAGNOSIS — G4733 Obstructive sleep apnea (adult) (pediatric): Secondary | ICD-10-CM | POA: Diagnosis not present

## 2024-02-05 DIAGNOSIS — G4733 Obstructive sleep apnea (adult) (pediatric): Secondary | ICD-10-CM | POA: Diagnosis not present

## 2024-02-12 ENCOUNTER — Encounter: Payer: Self-pay | Admitting: Cardiology

## 2024-02-17 DIAGNOSIS — I471 Supraventricular tachycardia, unspecified: Secondary | ICD-10-CM | POA: Diagnosis not present

## 2024-02-17 DIAGNOSIS — Z9889 Other specified postprocedural states: Secondary | ICD-10-CM | POA: Diagnosis not present

## 2024-02-17 DIAGNOSIS — G4733 Obstructive sleep apnea (adult) (pediatric): Secondary | ICD-10-CM | POA: Diagnosis not present

## 2024-02-17 DIAGNOSIS — I480 Paroxysmal atrial fibrillation: Secondary | ICD-10-CM | POA: Diagnosis not present

## 2024-02-17 DIAGNOSIS — I493 Ventricular premature depolarization: Secondary | ICD-10-CM | POA: Diagnosis not present

## 2024-02-18 DIAGNOSIS — I444 Left anterior fascicular block: Secondary | ICD-10-CM | POA: Diagnosis not present

## 2024-03-07 DIAGNOSIS — G4733 Obstructive sleep apnea (adult) (pediatric): Secondary | ICD-10-CM | POA: Diagnosis not present

## 2024-03-16 ENCOUNTER — Other Ambulatory Visit: Payer: Self-pay

## 2024-03-16 ENCOUNTER — Encounter: Payer: Self-pay | Admitting: Cardiology

## 2024-03-16 DIAGNOSIS — I493 Ventricular premature depolarization: Secondary | ICD-10-CM

## 2024-03-16 DIAGNOSIS — I4892 Unspecified atrial flutter: Secondary | ICD-10-CM

## 2024-03-17 DIAGNOSIS — J342 Deviated nasal septum: Secondary | ICD-10-CM | POA: Diagnosis not present

## 2024-03-17 DIAGNOSIS — J338 Other polyp of sinus: Secondary | ICD-10-CM | POA: Diagnosis not present

## 2024-03-17 DIAGNOSIS — G4733 Obstructive sleep apnea (adult) (pediatric): Secondary | ICD-10-CM | POA: Diagnosis not present

## 2024-04-01 ENCOUNTER — Ambulatory Visit: Payer: Self-pay

## 2024-04-01 NOTE — Telephone Encounter (Signed)
 FYI Only or Action Required?: FYI only for provider.  Patient was last seen in primary care on 12/02/2023 by Micheal Wolm ORN, MD.  Called Nurse Triage reporting Rash.  Symptoms began about a month ago.  Interventions attempted: OTC medications: Lotrimin cream.  Symptoms are: unchanged.  Triage Disposition: See PCP When Office is Open (Within 3 Days)  Patient/caregiver understands and will follow disposition?: Yes  **Appt scheduled for 7/28**          Message from [place] D sent at 04/01/2024  8:44 AM EDT  Patient has a rash on his back he says it comes and goes he's seen his pcp about it before.   Reason for Disposition  Localized rash present > 7 days  Answer Assessment - Initial Assessment Questions 1. APPEARANCE of RASH: What does the rash look like? (e.g., blisters, dry flaky skin, red spots, redness, sores)     Red, edge, raised, pink down below, using lotrimin.  2. LOCATION: Where is the rash located?      Above sacrum   3. NUMBER: How many spots are there?      One   4. SIZE: How big are the spots? (e.g., inches, cm; or compare to size of pinhead, tip of pen, eraser, pea)       3-4 inches across  5. ONSET: When did the rash start?      X 1 month   6. ITCHING: Does the rash itch? If Yes, ask: How bad is the itch?  (Scale 0-10; or none, mild, moderate, severe)     Intermittent  7. PAIN: Does the rash hurt? If Yes, ask: How bad is the pain?  (Scale 0-10; or none, mild, moderate, severe)     No   8. OTHER SYMPTOMS: Do you have any other symptoms? (e.g., fever)  No   Had ringworm last summer, similar symptoms now have returned.  Protocols used: Rash or Redness - Localized-A-AH

## 2024-04-04 ENCOUNTER — Encounter: Payer: Self-pay | Admitting: Family Medicine

## 2024-04-04 ENCOUNTER — Ambulatory Visit (INDEPENDENT_AMBULATORY_CARE_PROVIDER_SITE_OTHER): Admitting: Family Medicine

## 2024-04-04 VITALS — BP 112/62 | HR 53 | Temp 97.9°F | Ht 71.0 in | Wt 158.8 lb

## 2024-04-04 DIAGNOSIS — R21 Rash and other nonspecific skin eruption: Secondary | ICD-10-CM | POA: Diagnosis not present

## 2024-04-04 NOTE — Progress Notes (Signed)
 Established Patient Office Visit  Subjective   Patient ID: Eric Kaufman, male    DOB: 14-Apr-1951  Age: 73 y.o. MRN: [id number]  Chief Complaint  Patient presents with   Rash    Patient complains of an itchy, dry rash noted on the right low back x1 month, tried Lotrimin with some relief    HPI   Eric Kaufman is seen with recurrent rash lumbar region.  He had somewhat similar rash months ago which was felt to be possible tenia corporis.  This eventually cleared with Lotrimin.  We did perform KOH and culture at that time which came back negative.  Current rash is slightly more superior in location.  Slightly scaly.  Denies any other areas of rash.  No clear exacerbating factors.  He is having some ongoing issues with symptomatic PVCs.  Looking at possible ablation procedure soon.  He remains on flecainide and takes diltiazem .  No recent chest pains.  Past Medical History:  Diagnosis Date   Anxiety    PAF (paroxysmal atrial fibrillation) (HCC)    Palpitations    Papilledema    PSVT    Sleep apnea    mild uses cpap   Thyroiditis    Past Surgical History:  Procedure Laterality Date   COLONOSCOPY  2013   COLONOSCOPY WITH PROPOFOL   05/14/2016   Dr.Stark   heart ablation N/A 02/16/2015   NASAL SINUS SURGERY  04/03/2015   POLYPECTOMY      reports that he has never smoked. He has never used smokeless tobacco. He reports current alcohol use. He reports that he does not use drugs. family history includes Cancer in his father and mother; Colon cancer in his maternal uncle and mother; Colon polyps in his sister; Hypertension in his mother; Lung cancer in his father. Allergies  Allergen Reactions   Aspirin Swelling   Penicillins     Per pt: unknown    Review of Systems  Respiratory:  Negative for shortness of breath.   Cardiovascular:  Negative for chest pain.  Skin:  Positive for rash.  Neurological:  Negative for dizziness, weakness and headaches.      Objective:     BP  112/62   Pulse (!) 53   Temp 97.9 F (36.6 C) (Oral)   Ht 5' 11 (1.803 m)   Wt 158 lb 12.8 oz (72 kg)   SpO2 97%   BMI 22.15 kg/m    Physical Exam Vitals reviewed.  Constitutional:      General: He is not in acute distress.    Appearance: He is not ill-appearing.  Cardiovascular:     Rate and Rhythm: Normal rate and regular rhythm.  Skin:    Findings: Rash present.     Comments: He has somewhat oval shaped area of rash which is approximately 4 x 3 cm lower lumbar region.  Mildly erythematous and scaly surface.  Fairly well-demarcated border.  No clearing near the center.  Neurological:     Mental Status: He is alert.      No results found for any visits on 04/04/24.    The 10-year ASCVD risk score (Arnett DK, et al., 2019) is: 15.2%    Assessment & Plan:   Skin rash lumbar area.  Scaly quality.  Questionable past history of tenia corporis.  Not clear this is ringworm.  No significant clearing near the center.  Does have fairly well-demarcated border but no real elevation of border.  Patient has some leftover clotrimazole betamethasone cream  we will try that twice daily for the next couple weeks.  Be in touch if rash not clearing in 2 weeks.  Wolm Scarlet, MD

## 2024-04-04 NOTE — Patient Instructions (Signed)
 Let me know in two weeks if rash not improving with the betamethasone-Clotrimazole cream.

## 2024-04-06 ENCOUNTER — Ambulatory Visit: Payer: Self-pay

## 2024-04-06 DIAGNOSIS — G4733 Obstructive sleep apnea (adult) (pediatric): Secondary | ICD-10-CM | POA: Diagnosis not present

## 2024-04-06 NOTE — Telephone Encounter (Signed)
   FYI Only or Action Required?: FYI only for provider.  Patient was last seen in primary care on 04/04/2024 by Micheal Wolm ORN, MD.  Called Nurse Triage reporting itchy bumps on hand.  Symptoms began a week ago.  Interventions attempted: OTC medications: cortisone.  Symptoms are: gradually worsening.  Triage Disposition: See PCP When Office is Open (Within 3 Days)  Patient/caregiver understands and will follow disposition?: Yes   Message from Latimer D sent at 04/06/2024  9:58 AM EDT  Patient has bumps on left hand, had scabies once before years ago & would like to rule that out. He said that the bumps are itchy.   Reason for Disposition  Localized rash present > 7 days  Answer Assessment - Initial Assessment Questions 1. APPEARANCE of RASH: What does the rash look like? (e.g., blisters, dry flaky skin, red spots, redness, sores)     Small, red bumps, close together on fingers 2. LOCATION: Where is the rash located?      Left hand/in between fingers  3. NUMBER: How many spots are there?      8-9 4. SIZE: How big are the spots? (e.g., inches, cm; or compare to size of pinhead, tip of pen, eraser, pea)      small 5. ONSET: When did the rash start?      One week ago 6. ITCHING: Does the rash itch? If Yes, ask: How bad is the itch?  (Scale 0-10; or none, mild, moderate, severe)     Itchy at times 7. PAIN: Does the rash hurt? If Yes, ask: How bad is the pain?  (Scale 0-10; or none, mild, moderate, severe)     Non tender 8. OTHER SYMPTOMS: Do you have any other symptoms? (e.g., fever)     denies 9. PREGNANCY: Is there any chance you are pregnant? When was your last menstrual period?     N/a  Protocols used: Rash or Redness - Localized-A-AH

## 2024-04-08 ENCOUNTER — Ambulatory Visit: Admitting: Family Medicine

## 2024-04-28 ENCOUNTER — Ambulatory Visit: Attending: Cardiology | Admitting: Cardiology

## 2024-04-28 ENCOUNTER — Encounter: Payer: Self-pay | Admitting: Cardiology

## 2024-04-28 VITALS — BP 132/64 | HR 66 | Ht 71.0 in | Wt 156.0 lb

## 2024-04-28 DIAGNOSIS — I480 Paroxysmal atrial fibrillation: Secondary | ICD-10-CM | POA: Diagnosis not present

## 2024-04-28 DIAGNOSIS — I483 Typical atrial flutter: Secondary | ICD-10-CM

## 2024-04-28 DIAGNOSIS — I493 Ventricular premature depolarization: Secondary | ICD-10-CM | POA: Diagnosis not present

## 2024-04-28 DIAGNOSIS — I471 Supraventricular tachycardia, unspecified: Secondary | ICD-10-CM

## 2024-04-28 NOTE — Progress Notes (Signed)
  Electrophysiology Office Note:   Date:  04/28/2024  ID:  RISHAWN WALCK, DOB 15-Jun-1951, MRN [id number]  Primary Cardiologist: Redell Shallow, MD Primary Heart Failure: None Electrophysiologist: None      History of Present Illness:   Eric Kaufman is a 73 y.o. male with h/o paroxysmal atrial fibrillation, atrial flutter, AVNRT, PACs, PVCs, sleep apnea seen today for  for Electrophysiology evaluation of arrhythmias at the request of Damien Braver.    He has a history of AVNRT and atrial flutter ablation.  He has had breakthrough episodes of atrial fibrillation.  He also has symptomatic PACs treated with flecainide.  He wore a ZIO monitor November 2020 for which showed short episodes of atrial tachycardia with rare PACs and PVCs.  He is not quite symptomatic from his PVCs.  He has weakness, fatigue.  When he takes flecainide, he does not have palpitations or fatigue.  He has plans for possible upcoming ablation with Dr. Epifanio at [facility]. Review of systems complete and found to be negative unless listed in HPI.   EP Information / Studies Reviewed:    EKG is ordered today. Personal review as below.       Risk Assessment/Calculations:    CHA2DS2-VASc Score =     This indicates a  % annual risk of stroke. The patient's score is based upon:              Physical Exam:   VS:  There were no vitals taken for this visit.   Wt Readings from Last 3 Encounters:  04/04/24 158 lb 12.8 oz (72 kg)  12/02/23 157 lb 11.2 oz (71.5 kg)  11/23/23 157 lb 6.4 oz (71.4 kg)     GEN: Well nourished, well developed in no acute distress NECK: No JVD; No carotid bruits CARDIAC: Regular rate and rhythm, no murmurs, rubs, gallops RESPIRATORY:  Clear to auscultation without rales, wheezing or rhonchi  ABDOMEN: Soft, non-tender, non-distended EXTREMITIES:  No edema; No deformity   ASSESSMENT AND PLAN:    1.  AVNRT: Post ablation 2016.  No obvious recurrence  2.  Typical atrial flutter: Post  ablation 2016.  No obvious recurrence  3.  Paroxysmal atrial fibrillation: Minimal episodes  4.  PVCs: On flecainide.  He is minimally symptomatic on flecainide with a low burden of PVCs.  He has potential plans for ablation coming up with Dr. Epifanio.  He is curious as to whether or not it is reasonable to proceed with ablation.  I have told him that it sounds like Dr. Larina plan is reasonable and moving forward with ablation, if he is overly symptomatic, would be a reasonable option.  5.  PACs: No acute symptoms  Follow up with Dr. Inocencio as needed   Signed, Leather Estis Gladis Inocencio, MD

## 2024-04-28 NOTE — Patient Instructions (Signed)
 Medication Instructions:  Your physician recommends that you continue on your current medications as directed. Please refer to the Current Medication list given to you today.  *If you need a refill on your cardiac medications before your next appointment, please call your pharmacy*  Lab Work: If you have labs (blood work) drawn today and your tests are completely normal, you will receive your results only by: MyChart Message (if you have MyChart) OR A paper copy in the mail If you have any lab test that is abnormal or we need to change your treatment, we will call you to review the results.  Follow-Up: At [facility], you and your health needs are our priority.  As part of our continuing mission to provide you with exceptional heart care, our providers are all part of one team.  This team includes your primary Cardiologist (physician) and Advanced Practice Providers or APPs (Physician Assistants and Nurse Practitioners) who all work together to provide you with the care you need, when you need it.  Your next appointment:   As needed  Provider:   You may see Dr. Inocencio or one of the following Advanced Practice Providers on your designated Care Team:   Charlies Arthur, PA-C Michael Andy Tillery, PA-C Suzann Riddle, NP Daphne Barrack, NP    We recommend signing up for the patient portal called MyChart.  Sign up information is provided on this After Visit Summary.  MyChart is used to connect with patients for Virtual Visits (Telemedicine).  Patients are able to view lab/test results, encounter notes, upcoming appointments, etc.  Non-urgent messages can be sent to your provider as well.   To learn more about what you can do with MyChart, go to [email or web address].

## 2024-05-02 ENCOUNTER — Encounter: Payer: Self-pay | Admitting: Family Medicine

## 2024-05-02 DIAGNOSIS — G4733 Obstructive sleep apnea (adult) (pediatric): Secondary | ICD-10-CM | POA: Diagnosis not present

## 2024-05-02 MED ORDER — CLOTRIMAZOLE-BETAMETHASONE 1-0.05 % EX CREA: 1.0000 | TOPICAL_CREAM | Freq: Every day | CUTANEOUS | 0 refills | Status: AC

## 2024-05-07 DIAGNOSIS — G4733 Obstructive sleep apnea (adult) (pediatric): Secondary | ICD-10-CM | POA: Diagnosis not present

## 2024-05-12 ENCOUNTER — Other Ambulatory Visit: Payer: Self-pay | Admitting: Family Medicine

## 2024-05-20 ENCOUNTER — Ambulatory Visit (INDEPENDENT_AMBULATORY_CARE_PROVIDER_SITE_OTHER): Admitting: Family Medicine

## 2024-05-20 ENCOUNTER — Encounter: Payer: Self-pay | Admitting: Family Medicine

## 2024-05-20 VITALS — BP 118/60 | HR 62 | Temp 98.0°F | Wt 157.7 lb

## 2024-05-20 DIAGNOSIS — R0981 Nasal congestion: Secondary | ICD-10-CM

## 2024-05-20 DIAGNOSIS — R21 Rash and other nonspecific skin eruption: Secondary | ICD-10-CM

## 2024-05-20 LAB — POC COVID19 BINAXNOW: SARS Coronavirus 2 Ag: NEGATIVE

## 2024-05-20 NOTE — Progress Notes (Unsigned)
 Established Patient Office Visit  Subjective   Patient ID: Eric Kaufman, male    DOB: 01/14/1951  Age: 73 y.o. MRN: [id number]  Chief Complaint  Patient presents with   Nasal Congestion   Rash    HPI  {History (Optional):23778} Eric Kaufman is seen for the following items  Persistent rash lower lumbar region.  Fairly well-demarcated.  Slightly scaly.  He has tried clotrimazole  betamethasone  cream which he felt like helped some initially but has not fully cleared.  Minimal pruritus.  Did have scraping from another area back in March which came back negative for fungus.  No recent fever.  He relates onset Monday of some nasal congestion and minimal cough.  No fever.  Feels like symptoms are gradually improving but he is requesting COVID testing.  No known sick contacts.  Past Medical History:  Diagnosis Date   Anxiety    PAF (paroxysmal atrial fibrillation) (HCC)    Palpitations    Papilledema    PSVT    Sleep apnea    mild uses cpap   Thyroiditis    Past Surgical History:  Procedure Laterality Date   COLONOSCOPY  2013   COLONOSCOPY WITH PROPOFOL   05/14/2016   Dr.Stark   heart ablation N/A 02/16/2015   NASAL SINUS SURGERY  04/03/2015   POLYPECTOMY      reports that he has never smoked. He has never used smokeless tobacco. He reports current alcohol use. He reports that he does not use drugs. family history includes Cancer in his father and mother; Colon cancer in his maternal uncle and mother; Colon polyps in his sister; Hypertension in his mother; Lung cancer in his father. Allergies  Allergen Reactions   Aspirin Swelling   Penicillins     Per pt: unknown    Review of Systems  Constitutional:  Negative for chills and fever.  HENT:  Positive for congestion.   Respiratory:  Negative for shortness of breath.   Cardiovascular:  Negative for chest pain.  Skin:  Positive for rash.      Objective:     BP 118/60   Pulse 62   Temp 98 F (36.7 C) (Oral)   Wt  157 lb 11.2 oz (71.5 kg)   SpO2 96%   BMI 21.99 kg/m  BP Readings from Last 3 Encounters:  05/20/24 118/60  04/28/24 132/64  04/04/24 112/62   Wt Readings from Last 3 Encounters:  05/20/24 157 lb 11.2 oz (71.5 kg)  04/28/24 156 lb (70.8 kg)  04/04/24 158 lb 12.8 oz (72 kg)      Physical Exam Vitals reviewed.  Constitutional:      General: He is not in acute distress.    Appearance: He is not ill-appearing.  Cardiovascular:     Rate and Rhythm: Normal rate and regular rhythm.  Pulmonary:     Effort: Pulmonary effort is normal.     Breath sounds: Normal breath sounds.  Skin:    Findings: Rash present.     Comments: Rash lower lumbar region.  He has somewhat scalloped erythematous border which is well-demarcated with some scaliness centrally.  Neurological:     Mental Status: He is alert.      Results for orders placed or performed in visit on 05/20/24  POC COVID-19  Result Value Ref Range   SARS Coronavirus 2 Ag Negative Negative    {Labs (Optional):23779}  The 10-year ASCVD risk score (Arnett DK, et al., 2019) is: 16.5%    Assessment & Plan:  Problem List Items Addressed This Visit   None Visit Diagnoses       Skin rash    -  Primary   Relevant Orders   Culture, Fungus with Smear   Fungal Stain     Nasal congestion       Relevant Orders   POC COVID-19 (Completed)     Persistent skin rash.  He has tried clotrimazole  without much relief.  Doubt ringworm.  Obtain repeat scraping for KOH and fungal culture.  Nasal congestion with onset this past Monday with COVID testing negative.  Reassurance.  Suspect viral  No follow-ups on file.    Eric Scarlet, MD

## 2024-05-23 ENCOUNTER — Ambulatory Visit: Payer: Self-pay | Admitting: Family Medicine

## 2024-05-23 ENCOUNTER — Encounter: Payer: Self-pay | Admitting: Family Medicine

## 2024-05-23 DIAGNOSIS — R21 Rash and other nonspecific skin eruption: Secondary | ICD-10-CM

## 2024-06-07 ENCOUNTER — Ambulatory Visit: Payer: Self-pay

## 2024-06-07 NOTE — Telephone Encounter (Signed)
 FYI Only or Action Required?: FYI only for provider.  Patient was last seen in primary care on 05/20/2024 by Micheal Wolm ORN, MD.  Called Nurse Triage reporting Inguinal Hernia.  Symptoms began a year ago.  Interventions attempted: Nothing.  Symptoms are: unchanged.  Triage Disposition: See Physician Within 24 Hours  Patient/caregiver understands and will follow disposition?: Yes  **Appt scheduled for 10/1**       Copied from CRM #[id number]. Topic: Clinical - Red Word Triage >> Jun 07, 2024  8:44 AM Mia F wrote: Red Word that prompted transfer to Nurse Triage: Possible inguinal hernia. No pain but seems to be worsening. Has been there for about a year. Occasionally it can be sensitive. Pt think it could be affecting his bowels by causing constipation. Reason for Disposition  Can't reduce the hernia  (Note: NO pain, tenderness of hernia, or vomiting)  Answer Assessment - Initial Assessment Questions 1. ONSET:  When did this first appear?     X 1 year  2. APPEARANCE: What does it look like?     He reports a bulge, an inch in size   3. SIZE: How big is it? (e.g., inches, cm; or compare to coins, fruit)     He reports a bulge, an inch in size    4. LOCATION: Where exactly is the hernia located?     Right side   5. PATTERN: Does the swelling come and go, or has it been constant since it started?     No swelling   6. PAIN: Is there any pain? If Yes, ask: How bad is it?  (Scale 0-10; or none, mild, moderate, severe)      Mild intermittently   7. DIAGNOSIS: Have you been seen by a doctor (or NP/PA) for this? Did the doctor diagnose you as having a hernia?     Has not been evaluated by provider   8. OTHER SYMPTOMS: Do you have any other symptoms? (e.g., fever, abdomen pain, vomiting)  No    Patient scheduled for follow up on 10/1  Protocols used: [facility]

## 2024-06-08 ENCOUNTER — Ambulatory Visit: Admitting: Family Medicine

## 2024-06-08 ENCOUNTER — Encounter: Payer: Self-pay | Admitting: Family Medicine

## 2024-06-08 VITALS — BP 120/66 | HR 64 | Temp 97.7°F | Wt 156.5 lb

## 2024-06-08 DIAGNOSIS — K4090 Unilateral inguinal hernia, without obstruction or gangrene, not specified as recurrent: Secondary | ICD-10-CM | POA: Diagnosis not present

## 2024-06-08 NOTE — Progress Notes (Signed)
   Established Patient Office Visit  Subjective   Patient ID: RANIER COACH, male    DOB: Dec 17, 1950  Age: 73 y.o. MRN: [id number]  Chief Complaint  Patient presents with   Hernia    HPI    Mr. Delagarza is here for chronic medical follow-up.  He noted about a year and a half ago after planting some trees right inguinal bulge.  Relatively stable since that time.  No associated pain.  No dysuria.  No significant change in bowel habits.  He has scheduled follow-up colonoscopy soon.  No signs or symptoms of strangulation.  No prior history of hernia.  He states his dad did have inguinal hernia repair.  Past Medical History:  Diagnosis Date   Anxiety    PAF (paroxysmal atrial fibrillation) (HCC)    Palpitations    Papilledema    PSVT    Sleep apnea    mild uses cpap   Thyroiditis    Past Surgical History:  Procedure Laterality Date   COLONOSCOPY  2013   COLONOSCOPY WITH PROPOFOL   05/14/2016   Dr.Stark   heart ablation N/A 02/16/2015   NASAL SINUS SURGERY  04/03/2015   POLYPECTOMY      reports that he has never smoked. He has never used smokeless tobacco. He reports current alcohol use. He reports that he does not use drugs. family history includes Cancer in his father and mother; Colon cancer in his maternal uncle and mother; Colon polyps in his sister; Hypertension in his mother; Lung cancer in his father. Allergies  Allergen Reactions   Aspirin Swelling   Penicillins     Per pt: unknown  ]  Review of Systems  Constitutional:  Negative for chills and fever.  Gastrointestinal:  Negative for abdominal pain, blood in stool, constipation, diarrhea, nausea and vomiting.      Objective:     BP 120/66   Pulse 64   Temp 97.7 F (36.5 C) (Oral)   Wt 156 lb 8 oz (71 kg)   SpO2 97%   BMI 21.83 kg/m  BP Readings from Last 3 Encounters:  06/08/24 120/66  05/20/24 118/60  04/28/24 132/64   Wt Readings from Last 3 Encounters:  06/08/24 156 lb 8 oz (71 kg)  05/20/24  157 lb 11.2 oz (71.5 kg)  04/28/24 156 lb (70.8 kg)      Physical Exam Vitals reviewed.  Constitutional:      General: He is not in acute distress.    Appearance: He is not ill-appearing.  Cardiovascular:     Rate and Rhythm: Normal rate and regular rhythm.  Abdominal:     Comments: Right inguinal bulge which is more pronounced with cough.  Nontender.  Soft and easily reducible  Neurological:     Mental Status: He is alert.      No results found for any visits on 06/08/24.    The 10-year ASCVD risk score (Arnett DK, et al., 2019) is: 17%    Assessment & Plan:   Right inguinal hernia.  Reviewed signs and symptoms of strangulation to watch for.  Discussed possible referral to general surgeon but patient not interested at this time.  Follow-up promptly for any pain or other signs of strangulation   Wolm Scarlet, MD

## 2024-06-08 NOTE — Patient Instructions (Signed)
 Let me know if you want to pursue hernia repair.

## 2024-06-20 LAB — CULT, FUNGUS, SKIN,HAIR,NAIL W/KOH
CULTURE:: NO GROWTH
MICRO NUMBER:: 16967463
SMEAR:: NONE SEEN
SPECIMEN QUALITY:: ADEQUATE

## 2024-07-12 DIAGNOSIS — B358 Other dermatophytoses: Secondary | ICD-10-CM | POA: Diagnosis not present

## 2024-07-13 ENCOUNTER — Ambulatory Visit: Admitting: Physician Assistant

## 2024-08-12 DIAGNOSIS — G4733 Obstructive sleep apnea (adult) (pediatric): Secondary | ICD-10-CM | POA: Diagnosis not present

## 2024-08-15 ENCOUNTER — Ambulatory Visit: Admitting: Family Medicine

## 2024-08-22 DIAGNOSIS — I471 Supraventricular tachycardia, unspecified: Secondary | ICD-10-CM | POA: Diagnosis not present

## 2024-08-22 DIAGNOSIS — Z9889 Other specified postprocedural states: Secondary | ICD-10-CM | POA: Diagnosis not present

## 2024-08-22 DIAGNOSIS — I493 Ventricular premature depolarization: Secondary | ICD-10-CM | POA: Diagnosis not present

## 2024-08-22 DIAGNOSIS — G4733 Obstructive sleep apnea (adult) (pediatric): Secondary | ICD-10-CM | POA: Diagnosis not present

## 2024-10-19 ENCOUNTER — Ambulatory Visit: Admitting: Dermatology
